# Patient Record
Sex: Female | Born: 1970 | Race: White | Hispanic: No
Health system: Southern US, Community
[De-identification: ages and names within clinical notes are randomized; demographics above are authoritative.]

## PROBLEM LIST (undated history)

## (undated) DIAGNOSIS — R05 Cough: Secondary | ICD-10-CM

## (undated) DIAGNOSIS — R062 Wheezing: Secondary | ICD-10-CM

## (undated) DIAGNOSIS — J40 Bronchitis, not specified as acute or chronic: Secondary | ICD-10-CM

## (undated) DIAGNOSIS — M2550 Pain in unspecified joint: Secondary | ICD-10-CM

## (undated) DIAGNOSIS — T8859XA Other complications of anesthesia, initial encounter: Secondary | ICD-10-CM

## (undated) DIAGNOSIS — L409 Psoriasis, unspecified: Secondary | ICD-10-CM

## (undated) DIAGNOSIS — IMO0001 Reserved for inherently not codable concepts without codable children: Secondary | ICD-10-CM

## (undated) DIAGNOSIS — J45909 Unspecified asthma, uncomplicated: Secondary | ICD-10-CM

## (undated) DIAGNOSIS — Z9889 Other specified postprocedural states: Secondary | ICD-10-CM

## (undated) DIAGNOSIS — R112 Nausea with vomiting, unspecified: Secondary | ICD-10-CM

## (undated) DIAGNOSIS — M1990 Unspecified osteoarthritis, unspecified site: Secondary | ICD-10-CM

## (undated) DIAGNOSIS — H9319 Tinnitus, unspecified ear: Secondary | ICD-10-CM

## (undated) DIAGNOSIS — J302 Other seasonal allergic rhinitis: Secondary | ICD-10-CM

## (undated) DIAGNOSIS — R5383 Other fatigue: Secondary | ICD-10-CM

## (undated) DIAGNOSIS — T4145XA Adverse effect of unspecified anesthetic, initial encounter: Secondary | ICD-10-CM

## (undated) DIAGNOSIS — E739 Lactose intolerance, unspecified: Secondary | ICD-10-CM

## (undated) DIAGNOSIS — I82531 Chronic embolism and thrombosis of right popliteal vein: Secondary | ICD-10-CM

## (undated) DIAGNOSIS — R197 Diarrhea, unspecified: Secondary | ICD-10-CM

## (undated) DIAGNOSIS — B269 Mumps without complication: Secondary | ICD-10-CM

## (undated) DIAGNOSIS — R059 Cough, unspecified: Secondary | ICD-10-CM

## (undated) DIAGNOSIS — L4050 Arthropathic psoriasis, unspecified: Secondary | ICD-10-CM

## (undated) DIAGNOSIS — R251 Tremor, unspecified: Secondary | ICD-10-CM

## (undated) DIAGNOSIS — R351 Nocturia: Secondary | ICD-10-CM

## (undated) DIAGNOSIS — K219 Gastro-esophageal reflux disease without esophagitis: Secondary | ICD-10-CM

## (undated) DIAGNOSIS — A491 Streptococcal infection, unspecified site: Secondary | ICD-10-CM

## (undated) DIAGNOSIS — K9041 Non-celiac gluten sensitivity: Secondary | ICD-10-CM

## (undated) DIAGNOSIS — R61 Generalized hyperhidrosis: Secondary | ICD-10-CM

## (undated) HISTORY — DX: Tremor, unspecified: R25.1

## (undated) HISTORY — DX: Chronic embolism and thrombosis of right popliteal vein: I82.531

## (undated) HISTORY — PX: APPENDECTOMY: SHX54

## (undated) HISTORY — PX: BREAST SURGERY: SHX581

## (undated) HISTORY — DX: Psoriasis, unspecified: L40.9

## (undated) HISTORY — DX: Pain in unspecified joint: M25.50

## (undated) HISTORY — PX: OTHER SURGICAL HISTORY: SHX169

## (undated) HISTORY — DX: Arthropathic psoriasis, unspecified: L40.50

---

## 2000-12-22 ENCOUNTER — Ambulatory Visit (HOSPITAL_COMMUNITY): Admission: RE | Admit: 2000-12-22 | Discharge: 2000-12-22 | Payer: Self-pay | Admitting: Oral and Maxillofacial Surgery

## 2000-12-22 ENCOUNTER — Encounter: Payer: Self-pay | Admitting: Oral and Maxillofacial Surgery

## 2001-01-13 ENCOUNTER — Ambulatory Visit (HOSPITAL_COMMUNITY): Admission: RE | Admit: 2001-01-13 | Discharge: 2001-01-13 | Payer: Self-pay | Admitting: Oral and Maxillofacial Surgery

## 2001-01-13 ENCOUNTER — Encounter: Payer: Self-pay | Admitting: Oral and Maxillofacial Surgery

## 2001-02-23 ENCOUNTER — Other Ambulatory Visit: Admission: RE | Admit: 2001-02-23 | Discharge: 2001-02-23 | Payer: Self-pay | Admitting: Family Medicine

## 2001-03-08 ENCOUNTER — Encounter: Payer: Self-pay | Admitting: Oral and Maxillofacial Surgery

## 2001-03-08 ENCOUNTER — Encounter (INDEPENDENT_AMBULATORY_CARE_PROVIDER_SITE_OTHER): Payer: Self-pay

## 2001-03-08 ENCOUNTER — Observation Stay (HOSPITAL_COMMUNITY): Admission: RE | Admit: 2001-03-08 | Discharge: 2001-03-09 | Payer: Self-pay | Admitting: Oral and Maxillofacial Surgery

## 2002-06-23 ENCOUNTER — Encounter: Admission: RE | Admit: 2002-06-23 | Discharge: 2002-06-23 | Payer: Self-pay | Admitting: Neurology

## 2002-06-23 ENCOUNTER — Encounter: Payer: Self-pay | Admitting: Neurology

## 2002-06-29 ENCOUNTER — Encounter: Payer: Self-pay | Admitting: Neurology

## 2002-06-29 ENCOUNTER — Encounter: Admission: RE | Admit: 2002-06-29 | Discharge: 2002-06-29 | Payer: Self-pay | Admitting: Neurology

## 2002-07-05 ENCOUNTER — Other Ambulatory Visit: Admission: RE | Admit: 2002-07-05 | Discharge: 2002-07-05 | Payer: Self-pay | Admitting: *Deleted

## 2002-07-05 ENCOUNTER — Encounter: Payer: Self-pay | Admitting: *Deleted

## 2002-07-05 ENCOUNTER — Encounter: Admission: RE | Admit: 2002-07-05 | Discharge: 2002-07-05 | Payer: Self-pay | Admitting: *Deleted

## 2002-07-12 ENCOUNTER — Ambulatory Visit: Admission: RE | Admit: 2002-07-12 | Discharge: 2002-07-12 | Payer: Self-pay | Admitting: Gynecology

## 2002-07-19 ENCOUNTER — Encounter (INDEPENDENT_AMBULATORY_CARE_PROVIDER_SITE_OTHER): Payer: Self-pay

## 2002-07-19 ENCOUNTER — Observation Stay (HOSPITAL_COMMUNITY): Admission: RE | Admit: 2002-07-19 | Discharge: 2002-07-20 | Payer: Self-pay | Admitting: *Deleted

## 2002-09-26 ENCOUNTER — Encounter: Payer: Self-pay | Admitting: Neurology

## 2002-09-26 ENCOUNTER — Ambulatory Visit (HOSPITAL_COMMUNITY): Admission: RE | Admit: 2002-09-26 | Discharge: 2002-09-26 | Payer: Self-pay | Admitting: Neurology

## 2003-07-27 ENCOUNTER — Other Ambulatory Visit: Admission: RE | Admit: 2003-07-27 | Discharge: 2003-07-27 | Payer: Self-pay | Admitting: *Deleted

## 2005-04-28 ENCOUNTER — Inpatient Hospital Stay (HOSPITAL_COMMUNITY): Admission: AD | Admit: 2005-04-28 | Discharge: 2005-04-28 | Payer: Self-pay | Admitting: *Deleted

## 2005-05-01 ENCOUNTER — Inpatient Hospital Stay (HOSPITAL_COMMUNITY): Admission: AD | Admit: 2005-05-01 | Discharge: 2005-05-01 | Payer: Self-pay | Admitting: *Deleted

## 2005-05-04 ENCOUNTER — Inpatient Hospital Stay (HOSPITAL_COMMUNITY): Admission: AD | Admit: 2005-05-04 | Discharge: 2005-05-04 | Payer: Self-pay | Admitting: *Deleted

## 2005-05-07 ENCOUNTER — Inpatient Hospital Stay (HOSPITAL_COMMUNITY): Admission: AD | Admit: 2005-05-07 | Discharge: 2005-05-07 | Payer: Self-pay | Admitting: *Deleted

## 2005-05-10 ENCOUNTER — Inpatient Hospital Stay (HOSPITAL_COMMUNITY): Admission: AD | Admit: 2005-05-10 | Discharge: 2005-05-10 | Payer: Self-pay | Admitting: *Deleted

## 2005-11-12 ENCOUNTER — Other Ambulatory Visit: Admission: RE | Admit: 2005-11-12 | Discharge: 2005-11-12 | Payer: Self-pay | Admitting: *Deleted

## 2005-11-25 ENCOUNTER — Encounter (INDEPENDENT_AMBULATORY_CARE_PROVIDER_SITE_OTHER): Payer: Self-pay | Admitting: Specialist

## 2005-11-25 ENCOUNTER — Encounter: Admission: RE | Admit: 2005-11-25 | Discharge: 2005-11-25 | Payer: Self-pay | Admitting: Family Medicine

## 2007-02-03 ENCOUNTER — Other Ambulatory Visit: Admission: RE | Admit: 2007-02-03 | Discharge: 2007-02-03 | Payer: Self-pay | Admitting: *Deleted

## 2009-09-25 ENCOUNTER — Other Ambulatory Visit: Admission: RE | Admit: 2009-09-25 | Discharge: 2009-09-25 | Payer: Self-pay | Admitting: Family Medicine

## 2010-05-06 ENCOUNTER — Encounter: Admission: RE | Admit: 2010-05-06 | Discharge: 2010-05-06 | Payer: Self-pay | Admitting: Family Medicine

## 2010-09-27 ENCOUNTER — Other Ambulatory Visit (HOSPITAL_COMMUNITY)
Admission: RE | Admit: 2010-09-27 | Discharge: 2010-09-27 | Disposition: A | Discharge status: Home / Self Care | Payer: BC Managed Care – PPO | Source: Ambulatory Visit | Attending: Family Medicine | Admitting: Family Medicine

## 2010-09-27 ENCOUNTER — Other Ambulatory Visit: Payer: Self-pay | Admitting: Family Medicine

## 2010-09-27 DIAGNOSIS — Z124 Encounter for screening for malignant neoplasm of cervix: Secondary | ICD-10-CM | POA: Insufficient documentation

## 2010-10-18 NOTE — Discharge Summary (Signed)
NAME:  Ruth Charles, Ruth Charles                        ACCOUNT NO.:  [id number]   MEDICAL RECORD NO.:  [id number]                   PATIENT TYPE:  OBV   LOCATION:  0465                                 FACILITY:  [facility]   PHYSICIAN:  Almedia Balls. Fore, M.D.                DATE OF BIRTH:  [birth year]   DATE OF ADMISSION:  07/19/2002  DATE OF DISCHARGE:  07/20/2002                                 DISCHARGE SUMMARY   HISTORY:  The patient is a 40 year old with large abdominopelvic mass who  was admitted on February 17 for exploratory laparotomy, possible TAH/BSO,  omentectomy, lymph node dissection.  The remainder of her history and  physical are as previously dictated.   LABORATORY DATA:  Preoperative hemoglobin 14.6.  CMET panel was normal.  Chest x-ray was normal.  CA-125 had been elevated at approximately 42.   HOSPITAL COURSE:  The patient was taken to the operating room on July 19, 2002 at which time exploratory laparotomy, decompression of a large  right ovarian cyst, right salpingo-oophorectomy, and appendectomy indicated  because of question lesion at distal end of the appendix were performed.  The patient did well postoperatively.  Diet and ambulation were progressed  over the evening of February 17 and early morning of July 20, 2002.  On  the morning of July 20, 2002 she was afebrile and experiencing no  problems.  It was felt that she could be discharged at this time.   FINAL DIAGNOSES:  1. Right mucinous cystadenoma.  2. Lesion of appendix.  3. Pelvic pain.   OPERATION:  1. Exploratory laparotomy.  2. Right salpingo-oophorectomy.  3. Indicated appendectomy.  Pathology report unavailable at the time of     dictation except that a frozen section report on the cyst revealed all to     be benign.   DISPOSITION:  Discharged home to return to the office in two weeks for  follow-up.  She was instructed to gradually progress her activities after  several weeks at home and  to limit lifting and driving for two weeks.  She  is to return in approximately five to six days for removal of staples from  the incision.  She was fully ambulatory, on a regular diet, and in good  condition at the time of discharge.  She was given prescription for Dilaudid  or generic 2 mg number 30 to be taken one or two q.4h. p.r.n. pain and  doxycycline 100 mg number 12 to be taken one b.i.d.  She will call for any  problems.                                               Almedia Balls. Randell Patient, M.D.    SRF/MEDQ  D:  07/20/2002  T:  07/20/2002  Job:  [id number]

## 2010-10-18 NOTE — Consult Note (Signed)
NAME:  Ruth Charles, Ruth Charles NO.:  [id number]   MEDICAL RECORD NO.:  [id number]                   PATIENT TYPE:  OUT   LOCATION:  GYN                                  FACILITY:  [facility]   PHYSICIAN:  De Blanch, M.D.         DATE OF BIRTH:  02-Jun-1971   DATE OF CONSULTATION:  DATE OF DISCHARGE:                                   CONSULTATION   HISTORY OF PRESENT ILLNESS:  The patient is a 40 year old white female seen  in consultation at the request of Dr. Chriss Driver regarding management of a  newly diagnosed large abdominal mass.  The patient was relatively  asymptomatic until recently when she developed abdominal pain and sciatic  pain.  In the course of that evaluation, she was found to have a large mass.  CT scan shows that she has a 24 x 22 x 11 cm soft tissue mass in the pelvis  and extending into the upper abdomen.  It is inhomogeneous and contains a  small amount of central calcifications.  It is difficult to determine  radiographically whether the mass arises from the uterus or from the right  ovary.  There is no evidence of any metastatic disease, nor was there any  free fluid.  The upper abdomen is normal including kidneys and liver.  A  CA125 value has been obtained which is 42 units/ml.   PAST MEDICAL HISTORY:  Medical illnesses include gastroesophageal reflux  disease and psoriasis.   PAST SURGICAL HISTORY:  TMJ surgery 2002.   CURRENT MEDICATIONS:  Nexium.   ALLERGIES:  SULFA.   FAMILY HISTORY:  Negative for gynecologic, breast or colon cancers.   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  She  does not smoke and is not married.  Has never been pregnant.   REVIEW OF SYSTEMS:  Some abdominal pain and pressure, gastroesophageal  reflux symptoms and pelvic pain.   PHYSICAL EXAMINATION:  VITAL SIGNS:  Height 5 feet 4 inches, weight 181  pounds, blood pressure 130/86, pulse 80, respiratory rate 18.  GENERAL:  The patient is healthy white female in no acute distress.  HEENT:  Negative.  NECK:  Supple without thyromegaly. There is no supraclavicular or inguinal  adenopathy.  ABDOMEN:  Distended and there is a mass extending especially in the right  upper quadrant to the costal margin.  This is nontender.  PELVIC:  EGBUS, vagina, bladder, urethra normal.  Cervix is normal.  On  bimanual examination, the mass extends out of the pelvis.  It does not  really fill the posterior cul-de-sac.  It is difficult to distinguish the  mass from the uterus.   LABORATORY DATA:  The patient's scans and lab work from Dr. Delorse Lek office is  reviewed.   IMPRESSION:  Large abdominopelvic mass of either uterine fibroids or ovarian  neoplasm.  I had a lengthy discussion with the patient and her friend  regarding management options.  Obviously we recommend she undergo  exploratory laparotomy with resection of the mass.  Preservation of the  uterus would be attempted if this is uterine fibroids.  On the other hand,  if this is an ovarian neoplasm, I think it is unlikely that residual ovarian  tissue can be preserved on that side.  If there is a normal ovary, we will  definitely preserve it.  Further, if this is a malignancy, we discussed the  need for surgical staging including possible peritoneal biopsies,  omentectomy and lymphadenectomy.  The risks of surgery have been reviewed.  The patient wishes to proceed with surgery, understanding that she will need  a large midline incision and a mechanical bowel prep preoperatively.  All  their questions were answered.  Surgery is scheduled for next Tuesday.  The  patient understands that Dr. Ronita Hipps will be the gynecologic oncologist  involved with this case.                                                De Blanch, M.D.    DC/MEDQ  D:  07/12/2002  T:  07/12/2002  Job:  [id number]   cc:   Almedia Balls. Fore, M.D.  [phone] [address] [place]  [place] [address]  Fax: [phone]   Telford Nab, R.N.  [address] [place], [place] [address]  Fax: 1   Gretta Arab. Valentina Lucks, M.D.  [address] [place]  [place] [address]  Fax: [phone]   Candy Sledge, M.D.  [address]  [address]  [place]  [place] [address]  Fax: [phone]

## 2010-10-18 NOTE — Op Note (Signed)
NAME:  Ruth Charles, Ruth Charles                        ACCOUNT NO.:  [id number]   MEDICAL RECORD NO.:  [id number]                   PATIENT TYPE:  OUT   LOCATION:  GYN                                  FACILITY:  [facility]   PHYSICIAN:  Almedia Balls. Fore, M.D.                DATE OF BIRTH:  November 23, 1970   DATE OF PROCEDURE:  07/19/2002  DATE OF DISCHARGE:  07/12/2002                                 OPERATIVE REPORT   PREOPERATIVE DIAGNOSIS:  Large abdominopelvic mass, question ovarian  neoplasm.   POSTOPERATIVE DIAGNOSIS:  Probable mucinous cystadenoma, right ovary,  question involvement of appendix.   OPERATION PERFORMED:  Exploratory laparotomy, right salpingo-oophorectomy  and appendectomy.   CO-SURGEONS:  1. Almedia Balls. Randell Patient, M.D.  2. John T. Kyla Balzarine, M.D.   ANESTHESIA:  General orotracheal anesthesia.   INDICATIONS FOR PROCEDURE:  The patient is a 41 year old with large  abdominopelvic mass for exploratory laparotomy on July 19, 2002.  She  has been fully counseled as to the possibilities of the procedure to include  TAH/BSO, omentectomy, lymph node dissection and hormone replacement  following surgery as well as other considerations to include risks of  anesthesia, injury to bowel, bladder, blood vessels, ureters, postoperative  hemorrhage, infection and recuperation.  She fully understands all these  considerations and wishes to proceed on July 19, 2002.   OPERATIVE FINDINGS:  On entry into the abdomen, there was a large smooth-  walled cyst involving the right ovary approximately 24 to 25 cm in greatest  dimensions.  It was multiloculated with several smaller compartments.  The  largest compartment contained a thick mucinous material.  The secondary  smaller compartment contained more of a serous type fluid.  Full exploration  of the abdomen and pelvis revealed no apparent lesions on the liver,  diaphragms, periaortic areas or in the pelvis.  Bowel was also notably free  of any  lesions.   DESCRIPTION OF PROCEDURE:  With the patient under general anesthesia,  prepared and draped in sterile fashion in a dorsal lithotomy position in  Regal stirrups, and with the Foley catheter in the bladder, a lower  abdominal vertical incision was made and carried into the peritoneal cavity  without difficulty.  Small bleeders were rendered hemostatic with Bovie  electrocoagulation.  The cyst wall was encountered immediately upon entry  into the peritoneum.  Pelvic washings were taken and saved for cytology  later.  A pursestring suture was placed in the antimesenteric border of the  cyst wall with an incision and an attempt to evacuate the contents using  suction.  Because of the viscosity of the fluid, it was impossible to  provide adequate suction with the wall suction apparatus.  The Neptune  suction evacuator was then brought in to further evacuate the contents of  the cyst.  There was spill of cyst fluid within the peritoneal cavity.  A  second smaller loculated area  was then incised after a pursestring suture  with findings of serous fluid and easy evacuation with wall suction.  It was  then possible to elevate the cyst wall to isolate the infundibulopelvic  ligament which was accomplished without difficulty. The structure was then  clamped, cut, and doubly ligated with 0 Vicryl.  The ovary was then  dissected free of its attachment using Bovie electrocoagulation with a clamp  being placed across the right tube and uterine ovarian anastomosis.  This  was then cut free with the specimen being sent for frozen section.  The  cornual area on the uterus was rendered hemostatic and reapproximated with  two sutures of 0 Vicryl.  The frozen section report was that of a benign  probable mucinous tumor.  Attention was directed to the appendix which was  found to have some thickening in the distal end.  The mesoappendix was  isolated with Bovie electrocoagulation.  The vessels were  clamped and  ligated with 1 Vicryl.  The appendix was clamped at its stump and removed  with the stump being tied with 0 Vicryl.  Bovie electrocoagulation was  necessary to further render the mesentery of the appendix hemostatic.  The  area was lavaged with 3L of normal saline solution to provide for removal of  all of the mucinous fluid which had been spilled in the peritoneum.  After  noting that the irrigation was clear, hemostasis maintained, and sponge and  instrument counts were correct, the peritoneum and fascial layers were  closed with a Smead-Jones closure of #1 PDS.  The subcutaneous fat layer was  lavaged with copious amounts of lactated Ringer's solution, and the skin was  reapproximated with staples.  The estimated blood loss for this procedure  was less than 50 ml.  The patient was taken to the recovery room in good  condition with clear urine in the Foley catheter tubing.  She will be placed  on 23 hour observation following surgery.                                                Almedia Balls. Randell Patient, M.D.    SRF/MEDQ  D:  07/19/2002  T:  07/19/2002  Job:  [id number]

## 2010-10-18 NOTE — H&P (Signed)
NAME:  ROYALTI, SCHAUF                        ACCOUNT NO.:  [id number]   MEDICAL RECORD NO.:  [id number]                   PATIENT TYPE:  INP   LOCATION:  NA                                   FACILITY:  [facility]   PHYSICIAN:  Almedia Balls. Fore, M.D.                DATE OF BIRTH:  March 15, 1971   DATE OF ADMISSION:  07/19/2002  DATE OF DISCHARGE:                                HISTORY & PHYSICAL   CHIEF COMPLAINT:  Left abdominal mass.   HISTORY OF PRESENT ILLNESS:  The patient is a 40 year old gravida 0 whose  last menstrual period began on July 14, 2002.  She has been seen in our  office since July 05, 2002, for large left abdominopelvic mass.  This was  found on CT scan performed on June 29, 2002.  The mass was described as a  24 x 0.6 x 22.6 x 11.8 cm soft-tissue mass extending from the upper portion  of the uterine fundus into the left upper abdominal area.  There were no  enlarged lymph nodes and no free peritoneal fluid.  The left ovary was  separate from the mass and had a 2.3 cm simple-appearing cyst.  A CA125 was  obtained at 42.1, elevated over the normal 30.2 upper limits.  Pap smear was  negative on July 05, 2002.  The patient is admitted at this time for  exploratory laparotomy, possible abdominal hysterectomy, bilateral salpingo-  oophorectomy, omentectomy, and lymph node dissection.  She has been fully  counseled as to the nature of this procedure and the risks involved to  include risks of anesthesia, injury to bowel, bladder, blood vessels,  ureters, postoperative hemorrhage, infection, recuperation, and use of  hormone replacement should her ovaries be removed.  She fully understands  all these considerations and wishes to proceed on July 19, 2002.   PAST MEDICAL HISTORY:  1. TMJ surgery in November 2002.  2. She has apparent GERD and takes Nexium for this.   ALLERGIES:  She is allergic to SULFA-BASED ANTIBIOTICS.   SOCIAL HISTORY:  She is a  nonsmoker.   FAMILY HISTORY:  Maternal grandmother and paternal grandfather with  cardiovascular disease, and a maternal uncle with diabetes mellitus.   REVIEW OF SYSTEMS:  HEENT:  Negative.  CARDIORESPIRATORY:  Negative.  GASTROINTESTINAL:  As noted above.  GENITOURINARY:  As noted above.  NEUROMUSCULAR:  Negative.   PHYSICAL EXAMINATION:  VITAL SIGNS:  Height 5 feet 4-1/4 inches, weight 185.  Blood pressure 130/80, pulse 84, respirations 18.  GENERAL:  Well-developed white female in no acute distress.  HEENT:  Within normal limits.  NECK:  Supple.  Without masses, adenopathy, or bruit.  HEART:  Regular rate and rhythm.  Without murmurs.  LUNGS:  Clear to P&A.  BREASTS:  Examined sitting and lying without mass.  Axilla negative.  ABDOMEN:  Mass in the left lower abdomen to left upper abdomen area,  tender  on palpation.  PELVIC:  External genitalia, Bartholin, urethral, and Skene's glands within  normal limits.  Cervix slightly inflamed.  Uterus is difficult to define but  believe that it is mid position and normal in size, shape, and contour, with  slight displacement to the right.  Adnexal exam reveals a mass effect on the  left without definite palpable ovaries.  Anterior and posterior cul-de-sac  exam was confirmatory.  EXTREMITIES:  Within normal limits.  CENTRAL NERVOUS SYSTEM:  Grossly intact.  SKIN:  Without suspicious lesions.   IMPRESSION:  Abdominopelvic mass.   DISPOSITION:  As noted above.                                               Almedia Balls. Randell Patient, M.D.    SRF/MEDQ  D:  07/14/2002  T:  07/14/2002  Job:  [id number]

## 2010-10-18 NOTE — Op Note (Signed)
[facility]  Patient:    Ruth Charles, Ruth Charles Visit Number: [id number] MRN: [id number]          Service Type: SUR Location: 4W 0442 01 Attending Physician:  Beatriz Chancellor Dictated by:   Lyndal Pulley Aquilla Hacker., M.D. Proc. Date: 03/08/01 Admit Date:  03/08/2001                             Operative Report  PREOPERATIVE DIAGNOSES: 1. Right mandibular condylar mass. 2. Right temporomandibular joint internal derangement.  POSTOPERATIVE DIAGNOSES: 1. Right mandibular condylar mass. 2. Right temporomandibular joint internal derangement.  OPERATION: 1. Right temporomandibular joint arthroplasty. 2. Removal of right mandibular condylar mass.  SURGEON:  Lyndal Pulley. Aquilla Hacker., M.D.  ANESTHESIA:  General via nasoendotracheal tube.  INDICATIONS:  The patient is a 40 year old lady who was initially referred to the office with right TMJ and facial pain.  On detailed examination, CT scan, MRI scan, a large right mandibular condylar mass was identified.  DESCRIPTION OF PROCEDURE: The patient was identified in the holding area and taken to the operating room where she was placed supine on the operating room table.  Following intravenous induction of anesthesia, the patient was taken to the operating room where she was placed supine on the operating room table. Following intravenous induction of anesthesia, the patient was nasally intubated without difficulty.  The nasal endotracheal tube was secured, and the patients head turned to the left 90 degrees.  The table was also turned 90 degrees from anesthesia to provide access to the right facial area.  The patient was then prepped and draped in the usual fashion for TMJ procedures. Attention was directed to the right preauricular area where a fine surgical marker was used to outline the preauricular and endaural incision to be made. Approximately 4 cc of 2% lidocaine with epinephrine was  infiltrated subcutaneously along the planned incision line.  A #15 scalpel was used to make the skin incision which was carried endaurally.  Bipolar electrocautery was utilized throughout the procedure for hemostasis.  A layered dissection was then carried down to the superficial layer of the deep temporal fascia along the entire preauricular area.  A small hockey stick shaped incision was then created through the superficial layer of the deep temporal fascia over the zygomatic arch identifying the temporal fat pad.  The incision was carried inferiorly over the lateral capsular area.  A periosteal elevator was used to carefully reflect a subperiosteal dissection over the zygomatic arch revealing the lateral capsule inferiorly.  Using condylar retraction the superior joint space of the right TMJ was entered without difficulty.  Both the fossa and the distal tissue were inspected and found to be essentially normal.  There were no arthritic changes seen within the fossa, and the disk was smooth and thin. The inferior joint space was entered as well, and the right mandibular condyle identified.  It was seen as enlarged both anteriorly and medially.  Using a reciprocating Stryker saw an approximate 8 mm superior portion of the condyle was removed.  Approximately 2 mm of additional condylar mass was removed from the anterior pole.  The edges of the remaining condyle were then rasped utilizing the reciprocating Stryker rasp.  A hand-held rasp was also used to refine the margins of the remaining condyle.  The operative site was irrigated with copious amounts of sterile saline and both a lateral capsulorrhaphy and a meniscal plication were completed utilizing  a 5-0 PDS suture in an interrupted fashion.  The overlying fascia and musculature was closed in a layered fashion as well utilizing 3-0 Vicryl suture in an interrupted fashion.  Several subcutaneous sutures were placed with 4-0 Vicryl suture and  the superior portion of the skin closed with a 6-0 nylon suture in a running baseball fashion.  The endaural incision was closed with 5-0 plain gut suture.  The external auditory canal was then visualized and irrigated free of clot and debris.  The wound was coated with Neosporin ointment and a pressure type facial dressing was placed with Fluffs, concentrating pressure in the preauricular area.  The patient was then allowed to awaken from the anesthesia, extubated in the operating room and transferred to the post anesthesia care unit in stable condition having tolerated the procedure well. Estimated blood loss was 50 cc.  Intraoperative medications included 1 g of Ancef and 10 mg of Decadron.  The mandibular condylar specimen was submitted to pathology for microscopic examination.  Dictated by:   Lyndal Pulley Aquilla Hacker., M.D. Attending Physician:  Beatriz Chancellor DD:  03/08/01 TD:  03/08/01 Job: [id number]

## 2010-10-18 NOTE — Op Note (Signed)
NAME:  Ruth Charles, Ruth Charles                        ACCOUNT NO.:  [id number]   MEDICAL RECORD NO.:  [id number]                   PATIENT TYPE:  INP   LOCATION:  X002                                 FACILITY:  [facility]   PHYSICIAN:  John T. Kyla Balzarine, M.D.                 DATE OF BIRTH:  1970/07/06   DATE OF PROCEDURE:  07/19/2002  DATE OF DISCHARGE:                                 OPERATIVE REPORT   PREOPERATIVE DIAGNOSIS:  Pelvic mass with elevated CA-125 value.   POSTOPERATIVE DIAGNOSES:  1. 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  2. Probable appendiceal fecalith.   PROCEDURES:  1. Right salpingo-oophorectomy.  2. Right appendectomy.   ANESTHESIA:  General endotracheal.   SURGEON:  John T. Kyla Balzarine, M.D.   ASSISTANT:  Almedia Balls. Randell Patient, M.D.   FINDINGS AND INDICATIONS FOR SURGERY:  This 40 year old woman presented with  abdominal pain and large abdominal mass.  CT scan revealed a 24 x 22 x 11 cm  mass arising from the pelvis.  CA-125 value was 42 units/mL.  Examination  under anesthesia confirmed a large mass, mobile and separate from the  uterus, extending from the central pelvis to the midepigastrium.  On  exploratory laparotomy, a smooth cyst was found to be replacing the right  ovary, measuring in excess of 20 cm, without adhesions to adjacent  structures.  There were no visible or palpable abnormalities and no free  fluid or mucinous material in the abdominal cavity.  Because of the large  size of the cyst, it was drained of approximately 3 L of mucinous material,  allowing delivery of the cyst through a subumbilical incision.  Because of  the extensive destruction of the right ovary and normal appearance of the  left tube and ovary, right salpingo-oophorectomy was performed.  Inspection  of omentum and careful palpation of the upper abdomen and lymph node regions  revealed no abnormalities.  Uterus, left tube, and ovary were essentially  normal, with ovulatory stigmata  of the left ovary.  The appendix had a 5 x 5  mm firm nodule at its tip, compatible with fecalith; because of the mucinous  nature of the tumor, appendectomy was indicated.  Frozen section  subsequently returned a benign, likely mucinous cystadenoma of the right  ovary.   DESCRIPTION OF PROCEDURE:  The patient was prepped and draped in the low  lithotomy position after examination under anesthesia revealed the above  findings.  Foley catheter was placed sterilely.  The patient was explored  through a lower abdominal midline incision, using scalpel and electrocautery  for hemostasis.  The peritoneum was opened and findings as described above  were encountered.  Lap pads were placed adjacent to the anterior surface of  the cyst to provide a dam.  A pursestring suture of 3-0 Vicryl was placed  and a stab incision made into the ovarian cyst.  Using a variety of suction  devices, approximately 2 L of mucinous material was evacuated from the major  portion of the cyst.  There was a septation and a separate cystic component  cephalad, which was likewise drained of approximately 1 L of slightly more  serous clear fluid.  Following drainage of the cyst, the right tube and  ovary were elevated out of the incision.  Uterus, left tube and ovary were  visualized and found to be abnormal.  A window was created in the  mesosalpinx, isolating the infundibulopelvic ligament on the right side.  This was crossclamped, divided, and ligated with free ties of 0 Vicryl.  The  mesosalpinx was incised with electrocautery, skeletonizing the right utero-  ovarian ligament and fallopian tube at the cornu of the uterus, where these  structures were crossclamped, divided, and suture ligated with two  transfixing sutures of 0 Vicryl.  Additional hemostasis was achieved with  electrocautery.  There had been spill of mucinous material during the  procedure.  While awaiting results of frozen section, the abdomen was   copiously irrigated with greater than 3 L of normal saline, removing all  mucinous material from the abdominal cavity.  Manual and visual exploration  was carried out, confirming the findings described above.  The gallbladder  was palpably normal.  Because the patient had the mucinous tumor and there  was a firm nodule at the tip of the appendix, appendectomy was indicated to  exclude the possibility of an occult appendiceal mucinous tumor metastatic  to the ovary.   The mesentery of the appendix was developed into pedicles, which were  controlled with electrocautery and suture ligatures of 2-0 Vicryl.  The base  of the appendix was crushed, crossclamped, and divided with a scalpel.  The  stump of the appendix was controlled with a transfixing suture of 2-0  Vicryl.  Additional irrigation was carried out and hemostasis achieved in  the mesentery of the appendix with electrocautery.  All packs and retractors  were removed and abdominal contents in the operative sites inspected.  The  abdominal wall was closed in layers, irrigating between layers and using 0  PDS to close the rectus muscles and fascia together in a running Smead-Jones  closure, and clips to close the skin.  The patient was returned to the  recovery room in stable condition.  The estimated blood loss 50 mL.  Transfusions:  None.  Drains, packs, etc:  Foley to dependent drainage.  Sponge and instrument counts correct.  Pathology specimens:  Right tube and  ovary, appendix.                                               John T. Kyla Balzarine, M.D.    JTS/MEDQ  D:  07/19/2002  T:  07/19/2002  Job:  [id number]   cc:   Almedia Balls. Fore, M.D.  [phone] [address] [place]  [place] [address]  Fax: [phone]   Telford Nab, R.N.   Gretta Arab. Valentina Lucks, M.D.  [address] [place]  [place] [address]  Fax: [phone]   Candy Sledge, M.D.  [address]  [address]  [place] [address]  Fax: [phone]

## 2011-10-01 ENCOUNTER — Other Ambulatory Visit: Payer: Self-pay | Admitting: Family Medicine

## 2011-10-01 ENCOUNTER — Other Ambulatory Visit (HOSPITAL_COMMUNITY)
Admission: RE | Admit: 2011-10-01 | Discharge: 2011-10-01 | Disposition: A | Discharge status: Home / Self Care | Payer: BC Managed Care – PPO | Source: Ambulatory Visit | Attending: Family Medicine | Admitting: Family Medicine

## 2011-10-01 DIAGNOSIS — Z124 Encounter for screening for malignant neoplasm of cervix: Secondary | ICD-10-CM | POA: Insufficient documentation

## 2014-10-27 NOTE — H&P (Signed)
UNICOMPARTMENTAL KNEE ADMISSION H&P  Patient is being admitted for left patellofemoral unicompartmental knee arthroplasty.  Subjective:  Chief Complaint:  Left knee patellofemoral compartmental primary OA /pain    HPI: Ruth Charles, 44 y.o. female female, has a history of pain and functional disability in the left and has failed non-surgical conservative treatments for greater than 12 weeks to include NSAID's and/or analgesics, corticosteriod injections, viscosupplementation injections and activity modification.  Onset of symptoms was gradual, starting years ago with gradually worsening course since that time. The patient noted no past surgery on the left knee(s).  Patient currently rates pain in the left knee(s) at 9 out of 10 with activity. Patient has night pain, worsening of pain with activity and weight bearing, pain that interferes with activities of daily living, pain with passive range of motion, crepitus and joint swelling.  Patient has evidence of periarticular osteophytes, joint space narrowing and almost complete loss of cartilage of the patellofemoral compartment by imaging studies.  There is no active infection.  Risks, benefits and expectations were discussed with the patient.  Risks including but not limited to the risk of anesthesia, blood clots, nerve damage, blood vessel damage, failure of the prosthesis, infection and up to and including death.  Patient understand the risks, benefits and expectations and wishes to proceed with surgery.   PCP: Astrid Divine, MD  D/C Plans:      Home with HHPT  Post-op Meds:       No Rx given /  Tranexamic Acid:      To be given - IV    Decadron:    It is to be given  FYI:     ASA post-op  Norco post-op    Past Medical History  Diagnosis Date  . Complication of anesthesia   . PONV (postoperative nausea and vomiting)   . Tinnitus   . Asthma   . Bronchitis     hx of   . GERD (gastroesophageal reflux disease)   .  Arthritis   . Mumps     hx of   . Streptococcal infection     hx of   . Night sweats   . Fatigue   . Shortness of breath dyspnea   . Seasonal allergies   . Wheezing   . Cough   . Diarrhea   . Frequent urination at night   . Lactose intolerance   . Gluten intolerance      Past Surgical History  Procedure Laterality Date  . Right tmj      2002  . Removal right ovary with cyst      2004  . Appendectomy      2004  . Breast surgery      breast biopsy left     Allergies  Allergen Reactions  . Corn-Containing Products Other (See Comments)    resp distress  . Gluten Meal Other (See Comments)    Nausea and diarrhea  . Lactose Intolerance (Gi)     resp distress   . Sulfa Antibiotics Hives    History  Substance Use Topics  . Smoking status: Never Smoker   . Smokeless tobacco: Never Used  . Alcohol Use: Yes     Comment: wine and liquor 8-14 glasses per week     No family history on file.   Review of Systems  Constitutional: Negative.   HENT: Negative.   Eyes: Negative.   Respiratory: Positive for cough (because of her allergies).   Cardiovascular: Negative.  Gastrointestinal: Positive for heartburn.  Genitourinary: Negative.   Musculoskeletal: Positive for joint pain.  Skin: Negative.   Neurological: Negative.   Endo/Heme/Allergies: Positive for environmental allergies.  Psychiatric/Behavioral: Positive for memory loss.     Objective:   Physical Exam  Constitutional: She is oriented to person, place, and time and well-developed, well-nourished, and in no distress.  HENT:  Head: Normocephalic.  Eyes: Pupils are equal, round, and reactive to light.  Neck: Neck supple. No JVD present. No tracheal deviation present. No thyromegaly present.  Cardiovascular: Normal rate, regular rhythm, normal heart sounds and intact distal pulses.   Pulmonary/Chest: Effort normal and breath sounds normal. No stridor. No respiratory distress. She has no wheezes.   Abdominal: Soft. There is no tenderness. There is no guarding.  Musculoskeletal:       Left knee: She exhibits swelling and bony tenderness. She exhibits no ecchymosis, no deformity, no laceration and no erythema. Tenderness found.  Lymphadenopathy:    She has no cervical adenopathy.  Neurological: She is alert and oriented to person, place, and time.  Skin: Skin is warm and dry.  Psychiatric: Affect normal.      Imaging Review Plain radiographs demonstrate severe degenerative joint disease of the left knee(s) patellofemoral compartment. The overall alignment is neutral. The bone quality appears to be good for age and reported activity level.  Assessment/Plan:  End stage arthritis, left knee patellofemoral compartment  The patient history, physical examination, clinical judgment of the provider and imaging studies are consistent with end stage degenerative joint disease of the left knee(s) and patellofemoral unicompartmental knee arthroplasty is deemed medically necessary. The treatment options including medical management, injection therapy arthroscopy and arthroplasty were discussed at length. The risks and benefits of total knee arthroplasty were presented and reviewed. The risks due to aseptic loosening, infection, stiffness, patella tracking problems, thromboembolic complications and other imponderables were discussed. The patient acknowledged the explanation, agreed to proceed with the plan and consent was signed. Patient is being admitted for outpatient / observation treatment for surgery, pain control, PT, OT, prophylactic antibiotics, VTE prophylaxis, progressive ambulation and ADL's and discharge planning. The patient is planning to be discharged home with home health services.        Anastasio Auerbach Susanna Benge   PA-C  11/05/2014, 1:57 PM

## 2014-11-02 ENCOUNTER — Encounter (HOSPITAL_COMMUNITY)
Admission: RE | Admit: 2014-11-02 | Discharge: 2014-11-02 | Disposition: A | Discharge status: Home / Self Care | Payer: Managed Care, Other (non HMO) | Source: Ambulatory Visit | Attending: Orthopedic Surgery | Admitting: Orthopedic Surgery

## 2014-11-02 ENCOUNTER — Encounter (HOSPITAL_COMMUNITY): Payer: Self-pay

## 2014-11-02 ENCOUNTER — Ambulatory Visit (HOSPITAL_COMMUNITY)
Admission: RE | Admit: 2014-11-02 | Discharge: 2014-11-02 | Disposition: A | Discharge status: Home / Self Care | Payer: Managed Care, Other (non HMO) | Source: Ambulatory Visit | Attending: Anesthesiology | Admitting: Anesthesiology

## 2014-11-02 DIAGNOSIS — J45909 Unspecified asthma, uncomplicated: Secondary | ICD-10-CM

## 2014-11-02 DIAGNOSIS — M1712 Unilateral primary osteoarthritis, left knee: Secondary | ICD-10-CM | POA: Diagnosis not present

## 2014-11-02 DIAGNOSIS — Z01818 Encounter for other preprocedural examination: Secondary | ICD-10-CM | POA: Insufficient documentation

## 2014-11-02 HISTORY — DX: Other fatigue: R53.83

## 2014-11-02 HISTORY — DX: Non-celiac gluten sensitivity: K90.41

## 2014-11-02 HISTORY — DX: Tinnitus, unspecified ear: H93.19

## 2014-11-02 HISTORY — DX: Streptococcal infection, unspecified site: A49.1

## 2014-11-02 HISTORY — DX: Gastro-esophageal reflux disease without esophagitis: K21.9

## 2014-11-02 HISTORY — DX: Cough, unspecified: R05.9

## 2014-11-02 HISTORY — DX: Lactose intolerance, unspecified: E73.9

## 2014-11-02 HISTORY — DX: Unspecified asthma, uncomplicated: J45.909

## 2014-11-02 HISTORY — DX: Other seasonal allergic rhinitis: J30.2

## 2014-11-02 HISTORY — DX: Reserved for inherently not codable concepts without codable children: IMO0001

## 2014-11-02 HISTORY — DX: Cough: R05

## 2014-11-02 HISTORY — DX: Other specified postprocedural states: Z98.890

## 2014-11-02 HISTORY — DX: Other complications of anesthesia, initial encounter: T88.59XA

## 2014-11-02 HISTORY — DX: Other specified postprocedural states: R11.2

## 2014-11-02 HISTORY — DX: Unspecified osteoarthritis, unspecified site: M19.90

## 2014-11-02 HISTORY — DX: Diarrhea, unspecified: R19.7

## 2014-11-02 HISTORY — DX: Mumps without complication: B26.9

## 2014-11-02 HISTORY — DX: Nocturia: R35.1

## 2014-11-02 HISTORY — DX: Generalized hyperhidrosis: R61

## 2014-11-02 HISTORY — DX: Wheezing: R06.2

## 2014-11-02 HISTORY — DX: Bronchitis, not specified as acute or chronic: J40

## 2014-11-02 HISTORY — DX: Adverse effect of unspecified anesthetic, initial encounter: T41.45XA

## 2014-11-02 LAB — CBC
HCT: 40.8 % (ref 36.0–46.0)
Hemoglobin: 13.5 g/dL (ref 12.0–15.0)
MCH: 35.1 pg — AB (ref 26.0–34.0)
MCHC: 33.1 g/dL (ref 30.0–36.0)
MCV: 106 fL — AB (ref 78.0–100.0)
Platelets: 288 10*3/uL (ref 150–400)
RBC: 3.85 MIL/uL — ABNORMAL LOW (ref 3.87–5.11)
RDW: 14.4 % (ref 11.5–15.5)
WBC: 8.5 10*3/uL (ref 4.0–10.5)

## 2014-11-02 LAB — BASIC METABOLIC PANEL
ANION GAP: 6 (ref 5–15)
BUN: 11 mg/dL (ref 6–20)
CALCIUM: 8.9 mg/dL (ref 8.9–10.3)
CHLORIDE: 107 mmol/L (ref 101–111)
CO2: 23 mmol/L (ref 22–32)
CREATININE: 0.75 mg/dL (ref 0.44–1.00)
GFR calc Af Amer: >60 mL/min (ref >60)
GFR calc non Af Amer: >60 mL/min (ref >60)
Glucose, Bld: 93 mg/dL (ref 65–99)
Potassium: 4.1 mmol/L (ref 3.5–5.1)
Sodium: 136 mmol/L (ref 135–145)

## 2014-11-02 LAB — HCG, SERUM, QUALITATIVE: Preg, Serum: NEGATIVE

## 2014-11-02 LAB — URINALYSIS, ROUTINE W REFLEX MICROSCOPIC
Bilirubin Urine: NEGATIVE
Glucose, UA: NEGATIVE mg/dL
Hgb urine dipstick: NEGATIVE
Ketones, ur: NEGATIVE mg/dL
Leukocytes, UA: NEGATIVE
Nitrite: NEGATIVE
PH: 6 (ref 5.0–8.0)
Protein, ur: NEGATIVE mg/dL
SPECIFIC GRAVITY, URINE: 1.018 (ref 1.005–1.030)
Urobilinogen, UA: 0.2 mg/dL (ref 0.0–1.0)

## 2014-11-02 LAB — APTT: aPTT: 25 seconds (ref 24–37)

## 2014-11-02 LAB — PROTIME-INR
INR: 1.03 (ref 0.00–1.49)
Prothrombin Time: 13.7 seconds (ref 11.6–15.2)

## 2014-11-02 LAB — SURGICAL PCR SCREEN
MRSA, PCR: NEGATIVE
Staphylococcus aureus: NEGATIVE

## 2014-11-02 LAB — ABO/RH: ABO/RH(D): B NEG

## 2014-11-02 NOTE — Progress Notes (Signed)
Pt states she has intolerance to corn related products. When placed in allergy section noted contraindication with Cefazolin. Dr Charlann Boxer made aware.

## 2014-11-02 NOTE — Patient Instructions (Addendum)
Ruth Charles  11/02/2014   Your procedure is scheduled on:  Monday November 06, 2014   Report to [facility] Main  Entrance and follow signs to               Short Stay Center arrive at 0530 AM.  Call this number if you have problems the morning of surgery [phone]   Remember: ONLY 1 PERSON MAY GO WITH YOU TO SHORT STAY TO GET  READY MORNING OF YOUR SURGERY.  Do not eat food or drink liquids :After Midnight.     Take these medicines the morning of surgery with A SIP OF WATER: ALprazolam (xanax) if needed; Albuterol Inhaler if needed (bring day of surgery); Symbicort Inhaler if needed (bring day of surgery); Claritin if needed; Flonase if needed (bring day of surgery                                You may not have any metal on your body including hair pins and              piercings  Do not wear jewelry, make-up, lotions, powders or perfumes, deodorant             Do not wear nail polish.  Do not shave  48 hours prior to surgery.                Do not bring valuables to the hospital. [facility] IS NOT             RESPONSIBLE   FOR VALUABLES.  Contacts, dentures or bridgework may not be worn into surgery.  Leave suitcase in the car. After surgery it may be brought to your room.                  Please read over the following fact sheets you were given:MRSA INFORMATION SHEET;BLOOD TRANSFUSION FACT SHEET;INCENTIVE SPIROMETRY  _____________________________________________________________________             [facility] - Preparing for Surgery Before surgery, you can play an important role.  Because skin is not sterile, your skin needs to be as free of germs as possible.  You can reduce the number of germs on your skin by washing with CHG (chlorahexidine gluconate) soap before surgery.  CHG is an antiseptic cleaner which kills germs and bonds with the skin to continue killing germs even after washing. Please DO NOT use if you have an allergy to CHG or  antibacterial soaps.  If your skin becomes reddened/irritated stop using the CHG and inform your nurse when you arrive at Short Stay. Do not shave (including legs and underarms) for at least 48 hours prior to the first CHG shower.  You may shave your face/neck. Please follow these instructions carefully:  1.  Shower with CHG Soap the night before surgery and the  morning of Surgery.  2.  If you choose to wash your hair, wash your hair first as usual with your  normal  shampoo.  3.  After you shampoo, rinse your hair and body thoroughly to remove the  shampoo.                           4.  Use CHG as you would any other liquid soap.  You can apply chg  directly  to the skin and wash                       Gently with a scrungie or clean washcloth.  5.  Apply the CHG Soap to your body ONLY FROM THE NECK DOWN.   Do not use on face/ open                           Wound or open sores. Avoid contact with eyes, ears mouth and genitals (private parts).                       Wash face,  Genitals (private parts) with your normal soap.             6.  Wash thoroughly, paying special attention to the area where your surgery  will be performed.  7.  Thoroughly rinse your body with warm water from the neck down.  8.  DO NOT shower/wash with your normal soap after using and rinsing off  the CHG Soap.                9.  Pat yourself dry with a clean towel.            10.  Wear clean pajamas.            11.  Place clean sheets on your bed the night of your first shower and do not  sleep with pets. Day of Surgery : Do not apply any lotions/deodorants the morning of surgery.  Please wear clean clothes to the hospital/surgery center.  FAILURE TO FOLLOW THESE INSTRUCTIONS MAY RESULT IN THE CANCELLATION OF YOUR SURGERY PATIENT SIGNATURE_________________________________  NURSE SIGNATURE__________________________________  ________________________________________________________________________   Adam Phenix  An incentive spirometer is a tool that can help keep your lungs clear and active. This tool measures how well you are filling your lungs with each breath. Taking long deep breaths may help reverse or decrease the chance of developing breathing (pulmonary) problems (especially infection) following:  A long period of time when you are unable to move or be active. BEFORE THE PROCEDURE   If the spirometer includes an indicator to show your best effort, your nurse or respiratory therapist will set it to a desired goal.  If possible, sit up straight or lean slightly forward. Try not to slouch.  Hold the incentive spirometer in an upright position. INSTRUCTIONS FOR USE   Sit on the edge of your bed if possible, or sit up as far as you can in bed or on a chair.  Hold the incentive spirometer in an upright position.  Breathe out normally.  Place the mouthpiece in your mouth and seal your lips tightly around it.  Breathe in slowly and as deeply as possible, raising the piston or the ball toward the top of the column.  Hold your breath for 3-5 seconds or for as long as possible. Allow the piston or ball to fall to the bottom of the column.  Remove the mouthpiece from your mouth and breathe out normally.  Rest for a few seconds and repeat Steps 1 through 7 at least 10 times every 1-2 hours when you are awake. Take your time and take a few normal breaths between deep breaths.  The spirometer may include an indicator to show your best effort. Use the indicator as a goal to work toward during each repetition.  After each set of 10 deep breaths, practice coughing to be sure your lungs are clear. If you have an incision (the cut made at the time of surgery), support your incision when coughing by placing a pillow or rolled up towels firmly against it. Once you are able to get out of bed, walk around indoors and cough well. You may stop using the incentive spirometer when instructed by  your caregiver.  RISKS AND COMPLICATIONS  Take your time so you do not get dizzy or light-headed.  If you are in pain, you may need to take or ask for pain medication before doing incentive spirometry. It is harder to take a deep breath if you are having pain. AFTER USE  Rest and breathe slowly and easily.  It can be helpful to keep track of a log of your progress. Your caregiver can provide you with a simple table to help with this. If you are using the spirometer at home, follow these instructions: [place] IF:   You are having difficultly using the spirometer.  You have trouble using the spirometer as often as instructed.  Your pain medication is not giving enough relief while using the spirometer.  You develop fever of 100.5 F (38.1 C) or higher. SEEK IMMEDIATE MEDICAL CARE IF:   You cough up bloody sputum that had not been present before.  You develop fever of 102 F (38.9 C) or greater.  You develop worsening pain at or near the incision site. MAKE SURE YOU:   Understand these instructions.  Will watch your condition.  Will get help right away if you are not doing well or get worse. Document Released: 09/29/2006 Document Revised: 08/11/2011 Document Reviewed: 11/30/2006 Exit[employer] Patient Information 2014 [employer], [place].   ________________________________________________________________________  WHAT IS A BLOOD TRANSFUSION? Blood Transfusion Information  A transfusion is the replacement of blood or some of its parts. Blood is made up of multiple cells which provide different functions.  Red blood cells carry oxygen and are used for blood loss replacement.  White blood cells fight against infection.  Platelets control bleeding.  Plasma helps clot blood.  Other blood products are available for specialized needs, such as hemophilia or other clotting disorders. BEFORE THE TRANSFUSION  Who gives blood for transfusions?   Healthy volunteers who are  fully evaluated to make sure their blood is safe. This is blood bank blood. Transfusion therapy is the safest it has ever been in the practice of medicine. Before blood is taken from a donor, a complete history is taken to make sure that person has no history of diseases nor engages in risky social behavior (examples are intravenous drug use or sexual activity with multiple partners). The donor's travel history is screened to minimize risk of transmitting infections, such as malaria. The donated blood is tested for signs of infectious diseases, such as HIV and hepatitis. The blood is then tested to be sure it is compatible with you in order to minimize the chance of a transfusion reaction. If you or a relative donates blood, this is often done in anticipation of surgery and is not appropriate for emergency situations. It takes many days to process the donated blood. RISKS AND COMPLICATIONS Although transfusion therapy is very safe and saves many lives, the main dangers of transfusion include:   Getting an infectious disease.  Developing a transfusion reaction. This is an allergic reaction to something in the blood you were given. Every precaution is taken to prevent this. The decision  to have a blood transfusion has been considered carefully by your caregiver before blood is given. Blood is not given unless the benefits outweigh the risks. AFTER THE TRANSFUSION  Right after receiving a blood transfusion, you will usually feel much better and more energetic. This is especially true if your red blood cells have gotten low (anemic). The transfusion raises the level of the red blood cells which carry oxygen, and this usually causes an energy increase.  The nurse administering the transfusion will monitor you carefully for complications. HOME CARE INSTRUCTIONS  No special instructions are needed after a transfusion. You may find your energy is better. Speak with your caregiver about any limitations on  activity for underlying diseases you may have. SEEK MEDICAL CARE IF:   Your condition is not improving after your transfusion.  You develop redness or irritation at the intravenous (IV) site. SEEK IMMEDIATE MEDICAL CARE IF:  Any of the following symptoms occur over the next 12 hours:  Shaking chills.  You have a temperature by mouth above 102 F (38.9 C), not controlled by medicine.  Chest, back, or muscle pain.  People around you feel you are not acting correctly or are confused.  Shortness of breath or difficulty breathing.  Dizziness and fainting.  You get a rash or develop hives.  You have a decrease in urine output.  Your urine turns a dark color or changes to pink, red, or brown. Any of the following symptoms occur over the next 10 days:  You have a temperature by mouth above 102 F (38.9 C), not controlled by medicine.  Shortness of breath.  Weakness after normal activity.  The white part of the eye turns yellow (jaundice).  You have a decrease in the amount of urine or are urinating less often.  Your urine turns a dark color or changes to pink, red, or brown. Document Released: 05/16/2000 Document Revised: 08/11/2011 Document Reviewed: 01/03/2008 [facility] Patient Information 2014 [place], [place].  _______________________________________________________________________

## 2014-11-05 ENCOUNTER — Encounter (HOSPITAL_COMMUNITY): Payer: Self-pay | Admitting: Anesthesiology

## 2014-11-05 NOTE — Anesthesia Preprocedure Evaluation (Addendum)
Anesthesia Evaluation  Patient identified by MRN, date of birth, ID band Patient awake    Reviewed: Allergy & Precautions, NPO status , Patient's Chart, lab work & pertinent test results  History of Anesthesia Complications (+) PONV and history of anesthetic complications  Airway Mallampati: II  TM Distance: >3 FB Neck ROM: Full    Dental no notable dental hx.    Pulmonary shortness of breath, asthma ,  breath sounds clear to auscultation  Pulmonary exam normal       Cardiovascular negative cardio ROS Normal cardiovascular examRhythm:Regular Rate:Normal     Neuro/Psych negative neurological ROS  negative psych ROS   GI/Hepatic Neg liver ROS, GERD-  ,  Endo/Other  negative endocrine ROS  Renal/GU negative Renal ROS  negative genitourinary   Musculoskeletal  (+) Arthritis -,   Abdominal   Peds negative pediatric ROS (+)  Hematology negative hematology ROS (+)   Anesthesia Other Findings   Reproductive/Obstetrics negative OB ROS                             Anesthesia Physical Anesthesia Plan  ASA: II  Anesthesia Plan: Spinal   Post-op Pain Management:    Induction: Intravenous  Airway Management Planned:   Additional Equipment:   Intra-op Plan:   Post-operative Plan:   Informed Consent: I have reviewed the patients History and Physical, chart, labs and discussed the procedure including the risks, benefits and alternatives for the proposed anesthesia with the patient or authorized representative who has indicated his/her understanding and acceptance.   Dental advisory given  Plan Discussed with: CRNA  Anesthesia Plan Comments: (Discussed risks and benefits of and differences between spinal and general. Discussed risks of spinal including headache, backache, failure, bleeding, infection, and nerve damage. Patient consents to spinal. Questions answered. Coagulation studies and  platelet count acceptable.)       Anesthesia Quick Evaluation

## 2014-11-06 ENCOUNTER — Encounter (HOSPITAL_COMMUNITY)
Admission: RE | Disposition: A | Discharge status: Home / Self Care | Payer: Self-pay | Source: Ambulatory Visit | Attending: Orthopedic Surgery

## 2014-11-06 ENCOUNTER — Ambulatory Visit (HOSPITAL_COMMUNITY): Payer: Managed Care, Other (non HMO) | Admitting: Anesthesiology

## 2014-11-06 ENCOUNTER — Encounter (HOSPITAL_COMMUNITY): Payer: Self-pay | Admitting: *Deleted

## 2014-11-06 ENCOUNTER — Observation Stay (HOSPITAL_COMMUNITY)
Admission: RE | Admit: 2014-11-06 | Discharge: 2014-11-07 | Disposition: A | Discharge status: Home / Self Care | Payer: Managed Care, Other (non HMO) | Source: Ambulatory Visit | Attending: Orthopedic Surgery | Admitting: Orthopedic Surgery

## 2014-11-06 DIAGNOSIS — Z882 Allergy status to sulfonamides status: Secondary | ICD-10-CM | POA: Diagnosis not present

## 2014-11-06 DIAGNOSIS — Z96652 Presence of left artificial knee joint: Secondary | ICD-10-CM

## 2014-11-06 DIAGNOSIS — Z79899 Other long term (current) drug therapy: Secondary | ICD-10-CM | POA: Diagnosis not present

## 2014-11-06 DIAGNOSIS — Z96659 Presence of unspecified artificial knee joint: Secondary | ICD-10-CM

## 2014-11-06 DIAGNOSIS — K219 Gastro-esophageal reflux disease without esophagitis: Secondary | ICD-10-CM | POA: Insufficient documentation

## 2014-11-06 DIAGNOSIS — Z9049 Acquired absence of other specified parts of digestive tract: Secondary | ICD-10-CM | POA: Insufficient documentation

## 2014-11-06 DIAGNOSIS — J45909 Unspecified asthma, uncomplicated: Secondary | ICD-10-CM | POA: Diagnosis not present

## 2014-11-06 DIAGNOSIS — M1712 Unilateral primary osteoarthritis, left knee: Secondary | ICD-10-CM | POA: Diagnosis present

## 2014-11-06 DIAGNOSIS — Z91018 Allergy to other foods: Secondary | ICD-10-CM | POA: Diagnosis not present

## 2014-11-06 HISTORY — PX: PATELLA-FEMORAL ARTHROPLASTY: SHX5037

## 2014-11-06 LAB — TYPE AND SCREEN
ABO/RH(D): B NEG
ANTIBODY SCREEN: NEGATIVE

## 2014-11-06 SURGERY — ARTHROPLASTY, PATELLOFEMORAL
Anesthesia: Spinal | Site: Knee | Laterality: Left

## 2014-11-06 MED ORDER — ONDANSETRON HCL 4 MG/2ML IJ SOLN: 4.0000 mg | Freq: Four times a day (QID) | INTRAMUSCULAR | Status: DC | PRN

## 2014-11-06 MED ORDER — EPHEDRINE SULFATE 50 MG/ML IJ SOLN
INTRAMUSCULAR | Status: AC
  Filled 2014-11-06: qty 1

## 2014-11-06 MED ORDER — DIPHENHYDRAMINE HCL 25 MG PO TABS
25.0000 mg | ORAL_TABLET | Freq: Four times a day (QID) | ORAL | Status: DC | PRN
  Filled 2014-11-06: qty 1

## 2014-11-06 MED ORDER — SODIUM CHLORIDE 0.9 % IV SOLN
1000.0000 mg | Freq: Once | INTRAVENOUS | Status: AC
  Administered 2014-11-06: 1000 mg via INTRAVENOUS
  Filled 2014-11-06: qty 10

## 2014-11-06 MED ORDER — ALBUTEROL SULFATE (2.5 MG/3ML) 0.083% IN NEBU: 2.5000 mg | INHALATION_SOLUTION | Freq: Four times a day (QID) | RESPIRATORY_TRACT | Status: DC | PRN

## 2014-11-06 MED ORDER — ASPIRIN EC 325 MG PO TBEC
325.0000 mg | DELAYED_RELEASE_TABLET | Freq: Two times a day (BID) | ORAL | Status: DC
  Administered 2014-11-07: 325 mg via ORAL
  Filled 2014-11-06 (×3): qty 1

## 2014-11-06 MED ORDER — CEFAZOLIN SODIUM-DEXTROSE 2-3 GM-% IV SOLR
2.0000 g | INTRAVENOUS | Status: AC
  Administered 2014-11-06: 2 g via INTRAVENOUS

## 2014-11-06 MED ORDER — ONDANSETRON HCL 4 MG/2ML IJ SOLN
INTRAMUSCULAR | Status: DC | PRN
  Administered 2014-11-06: 4 mg via INTRAVENOUS

## 2014-11-06 MED ORDER — SODIUM CHLORIDE 0.9 % IJ SOLN
INTRAMUSCULAR | Status: AC
  Filled 2014-11-06: qty 50

## 2014-11-06 MED ORDER — ONDANSETRON HCL 4 MG PO TABS
4.0000 mg | ORAL_TABLET | Freq: Four times a day (QID) | ORAL | Status: DC | PRN
  Filled 2014-11-06: qty 1

## 2014-11-06 MED ORDER — CEFAZOLIN SODIUM-DEXTROSE 2-3 GM-% IV SOLR
INTRAVENOUS | Status: AC
  Filled 2014-11-06: qty 50

## 2014-11-06 MED ORDER — MAGNESIUM CITRATE PO SOLN
1.0000 | Freq: Once | ORAL | Status: AC | PRN
  Filled 2014-11-06: qty 296

## 2014-11-06 MED ORDER — BISACODYL 10 MG RE SUPP
10.0000 mg | Freq: Every day | RECTAL | Status: DC | PRN
  Filled 2014-11-06: qty 1

## 2014-11-06 MED ORDER — MENTHOL 3 MG MT LOZG: 1.0000 | LOZENGE | OROMUCOSAL | Status: DC | PRN

## 2014-11-06 MED ORDER — LIDOCAINE HCL (CARDIAC) 20 MG/ML IV SOLN
INTRAVENOUS | Status: AC
  Filled 2014-11-06: qty 5

## 2014-11-06 MED ORDER — HYDROMORPHONE HCL 1 MG/ML IJ SOLN: 0.5000 mg | INTRAMUSCULAR | Status: DC | PRN

## 2014-11-06 MED ORDER — SODIUM CHLORIDE 0.9 % IV SOLN
INTRAVENOUS | Status: DC
  Administered 2014-11-06: 13:00:00 via INTRAVENOUS
  Filled 2014-11-06 (×9): qty 1000

## 2014-11-06 MED ORDER — DOCUSATE SODIUM 100 MG PO CAPS
100.0000 mg | ORAL_CAPSULE | Freq: Two times a day (BID) | ORAL | Status: DC
  Administered 2014-11-06 – 2014-11-07 (×3): 100 mg via ORAL
  Filled 2014-11-06 (×4): qty 1

## 2014-11-06 MED ORDER — ALUM & MAG HYDROXIDE-SIMETH 200-200-20 MG/5ML PO SUSP
30.0000 mL | ORAL | Status: DC | PRN
  Filled 2014-11-06: qty 30

## 2014-11-06 MED ORDER — GLYCOPYRROLATE 0.2 MG/ML IJ SOLN
INTRAMUSCULAR | Status: AC
  Filled 2014-11-06: qty 1

## 2014-11-06 MED ORDER — FERROUS SULFATE 325 (65 FE) MG PO TABS
325.0000 mg | ORAL_TABLET | Freq: Three times a day (TID) | ORAL | Status: DC
  Administered 2014-11-06 – 2014-11-07 (×3): 325 mg via ORAL
  Filled 2014-11-06 (×6): qty 1

## 2014-11-06 MED ORDER — PHENOL 1.4 % MT LIQD: 1.0000 | OROMUCOSAL | Status: DC | PRN

## 2014-11-06 MED ORDER — POLYETHYLENE GLYCOL 3350 17 G PO PACK
17.0000 g | PACK | Freq: Two times a day (BID) | ORAL | Status: DC
  Administered 2014-11-06: 17 g via ORAL
  Filled 2014-11-06 (×4): qty 1

## 2014-11-06 MED ORDER — LIDOCAINE HCL (CARDIAC) 20 MG/ML IV SOLN
INTRAVENOUS | Status: DC | PRN
  Administered 2014-11-06: 60 mg via INTRAVENOUS
  Administered 2014-11-06: 40 mg via INTRAVENOUS

## 2014-11-06 MED ORDER — SODIUM CHLORIDE 0.9 % IJ SOLN
INTRAMUSCULAR | Status: AC
  Filled 2014-11-06: qty 10

## 2014-11-06 MED ORDER — LACTATED RINGERS IV SOLN
INTRAVENOUS | Status: DC | PRN
  Administered 2014-11-06 (×2): via INTRAVENOUS

## 2014-11-06 MED ORDER — METHOCARBAMOL 1000 MG/10ML IJ SOLN
500.0000 mg | Freq: Four times a day (QID) | INTRAVENOUS | Status: DC | PRN
  Administered 2014-11-06: 500 mg via INTRAVENOUS
  Filled 2014-11-06 (×2): qty 5

## 2014-11-06 MED ORDER — KETOROLAC TROMETHAMINE 30 MG/ML IJ SOLN
INTRAMUSCULAR | Status: AC
  Filled 2014-11-06: qty 1

## 2014-11-06 MED ORDER — ALPRAZOLAM 0.25 MG PO TABS: 0.2500 mg | ORAL_TABLET | Freq: Three times a day (TID) | ORAL | Status: DC | PRN

## 2014-11-06 MED ORDER — PROPOFOL INFUSION 10 MG/ML OPTIME
INTRAVENOUS | Status: DC | PRN
  Administered 2014-11-06: 75 ug/kg/min via INTRAVENOUS

## 2014-11-06 MED ORDER — BUPIVACAINE IN DEXTROSE 0.75-8.25 % IT SOLN
INTRATHECAL | Status: DC | PRN
  Administered 2014-11-06: 1.8 mL via INTRATHECAL

## 2014-11-06 MED ORDER — METHOCARBAMOL 500 MG PO TABS
500.0000 mg | ORAL_TABLET | Freq: Four times a day (QID) | ORAL | Status: DC | PRN
  Administered 2014-11-06 – 2014-11-07 (×2): 500 mg via ORAL
  Filled 2014-11-06 (×2): qty 1

## 2014-11-06 MED ORDER — FENTANYL CITRATE (PF) 100 MCG/2ML IJ SOLN
INTRAMUSCULAR | Status: DC | PRN
  Administered 2014-11-06: 50 ug via INTRAVENOUS
  Administered 2014-11-06 (×2): 25 ug via INTRAVENOUS

## 2014-11-06 MED ORDER — DEXAMETHASONE SODIUM PHOSPHATE 10 MG/ML IJ SOLN
10.0000 mg | Freq: Once | INTRAMUSCULAR | Status: AC
  Administered 2014-11-06: 10 mg via INTRAVENOUS

## 2014-11-06 MED ORDER — SODIUM CHLORIDE 0.9 % IJ SOLN
INTRAMUSCULAR | Status: DC | PRN
Start: 2014-11-06 — End: 2014-11-06
  Administered 2014-11-06: 29 mL

## 2014-11-06 MED ORDER — 0.9 % SODIUM CHLORIDE (POUR BTL) OPTIME
TOPICAL | Status: DC | PRN
  Administered 2014-11-06: 1000 mL

## 2014-11-06 MED ORDER — CHLORHEXIDINE GLUCONATE CLOTH 2 % EX PADS: 6.0000 | MEDICATED_PAD | Freq: Once | CUTANEOUS | Status: DC

## 2014-11-06 MED ORDER — PROPOFOL 10 MG/ML IV BOLUS
INTRAVENOUS | Status: AC
  Filled 2014-11-06: qty 20

## 2014-11-06 MED ORDER — MIDAZOLAM HCL 2 MG/2ML IJ SOLN
INTRAMUSCULAR | Status: AC
  Filled 2014-11-06: qty 2

## 2014-11-06 MED ORDER — DEXAMETHASONE SODIUM PHOSPHATE 10 MG/ML IJ SOLN
10.0000 mg | Freq: Once | INTRAMUSCULAR | Status: AC
  Administered 2014-11-07: 10 mg via INTRAVENOUS
  Filled 2014-11-06: qty 1

## 2014-11-06 MED ORDER — HYDROCODONE-ACETAMINOPHEN 7.5-325 MG PO TABS
1.0000 | ORAL_TABLET | ORAL | Status: DC
  Administered 2014-11-06: 1 via ORAL
  Administered 2014-11-06 (×2): 2 via ORAL
  Administered 2014-11-06: 1 via ORAL
  Administered 2014-11-07 (×3): 2 via ORAL
  Filled 2014-11-06 (×2): qty 2
  Filled 2014-11-06: qty 1
  Filled 2014-11-06 (×4): qty 2

## 2014-11-06 MED ORDER — HYDROMORPHONE HCL 1 MG/ML IJ SOLN: 0.2500 mg | INTRAMUSCULAR | Status: DC | PRN

## 2014-11-06 MED ORDER — METOCLOPRAMIDE HCL 5 MG PO TABS
5.0000 mg | ORAL_TABLET | Freq: Three times a day (TID) | ORAL | Status: DC | PRN
  Filled 2014-11-06: qty 2

## 2014-11-06 MED ORDER — BUPIVACAINE-EPINEPHRINE (PF) 0.25% -1:200000 IJ SOLN
INTRAMUSCULAR | Status: AC
  Filled 2014-11-06: qty 30

## 2014-11-06 MED ORDER — BUDESONIDE-FORMOTEROL FUMARATE 160-4.5 MCG/ACT IN AERO
2.0000 | INHALATION_SPRAY | Freq: Two times a day (BID) | RESPIRATORY_TRACT | Status: DC
  Administered 2014-11-06 – 2014-11-07 (×2): 2 via RESPIRATORY_TRACT
  Filled 2014-11-06: qty 6

## 2014-11-06 MED ORDER — SODIUM CHLORIDE 0.9 % IR SOLN
Status: DC | PRN
  Administered 2014-11-06: 1000 mL

## 2014-11-06 MED ORDER — CEFAZOLIN SODIUM-DEXTROSE 2-3 GM-% IV SOLR
2.0000 g | Freq: Four times a day (QID) | INTRAVENOUS | Status: AC
  Administered 2014-11-06 (×2): 2 g via INTRAVENOUS
  Filled 2014-11-06 (×2): qty 50

## 2014-11-06 MED ORDER — LORATADINE 10 MG PO TABS
10.0000 mg | ORAL_TABLET | Freq: Every day | ORAL | Status: DC
  Administered 2014-11-07: 10 mg via ORAL
  Filled 2014-11-06 (×2): qty 1

## 2014-11-06 MED ORDER — APREMILAST 30 MG PO TABS
30.0000 mg | ORAL_TABLET | Freq: Two times a day (BID) | ORAL | Status: DC
  Administered 2014-11-06 – 2014-11-07 (×2): 30 mg via ORAL

## 2014-11-06 MED ORDER — BUPIVACAINE-EPINEPHRINE (PF) 0.25% -1:200000 IJ SOLN
INTRAMUSCULAR | Status: DC | PRN
  Administered 2014-11-06: 30 mL

## 2014-11-06 MED ORDER — METOCLOPRAMIDE HCL 5 MG/ML IJ SOLN: 5.0000 mg | Freq: Three times a day (TID) | INTRAMUSCULAR | Status: DC | PRN

## 2014-11-06 MED ORDER — MIDAZOLAM HCL 5 MG/5ML IJ SOLN
INTRAMUSCULAR | Status: DC | PRN
Start: 2014-11-06 — End: 2014-11-06
  Administered 2014-11-06: 2 mg via INTRAVENOUS

## 2014-11-06 MED ORDER — FENTANYL CITRATE (PF) 100 MCG/2ML IJ SOLN
INTRAMUSCULAR | Status: AC
  Filled 2014-11-06: qty 2

## 2014-11-06 MED ORDER — KETOROLAC TROMETHAMINE 30 MG/ML IJ SOLN
INTRAMUSCULAR | Status: DC | PRN
Start: 2014-11-06 — End: 2014-11-06
  Administered 2014-11-06: 30 mg

## 2014-11-06 MED ORDER — FLUTICASONE PROPIONATE 50 MCG/ACT NA SUSP
1.0000 | Freq: Every day | NASAL | Status: DC
  Administered 2014-11-07: 1 via NASAL
  Filled 2014-11-06: qty 16

## 2014-11-06 SURGICAL SUPPLY — 60 items
BAG ZIPLOCK 12X15 (MISCELLANEOUS) IMPLANT
BANDAGE ELASTIC 6 VELCRO ST LF (GAUZE/BANDAGES/DRESSINGS) ×3 IMPLANT
BANDAGE ESMARK 6X9 LF (GAUZE/BANDAGES/DRESSINGS) ×1 IMPLANT
BLADE SAG 18X100X1.27 (BLADE) ×3 IMPLANT
BNDG ESMARK 6X9 LF (GAUZE/BANDAGES/DRESSINGS) ×3
BOWL SMART MIX CTS (DISPOSABLE) ×3 IMPLANT
BUR SURG PFJ MILL NEXGEN (Knees) ×1 IMPLANT
BURR SURG PFJ MILL NEXGEN (Knees) ×3 IMPLANT
CAP KNEE PARTIAL 2 ×1 IMPLANT
CAPT KNEE PARTIAL 2 ×2 IMPLANT
CEMENT HV SMART SET (Cement) ×3 IMPLANT
CUFF TOURN SGL QUICK 34 (TOURNIQUET CUFF) ×3
CUFF TRNQT CYL 34X4X40X1 (TOURNIQUET CUFF) ×1 IMPLANT
DRAPE EXTREMITY T 121X128X90 (DRAPE) ×3 IMPLANT
DRAPE POUCH INSTRU U-SHP 10X18 (DRAPES) ×3 IMPLANT
DRAPE U-SHAPE 47X51 STRL (DRAPES) ×3 IMPLANT
DRSG AQUACEL AG ADV 3.5X10 (GAUZE/BANDAGES/DRESSINGS) ×3 IMPLANT
DURAPREP 26ML APPLICATOR (WOUND CARE) ×6 IMPLANT
ELECT REM PT RETURN 9FT ADLT (ELECTROSURGICAL) ×3
ELECTRODE REM PT RTRN 9FT ADLT (ELECTROSURGICAL) ×1 IMPLANT
FACESHIELD WRAPAROUND (MASK) ×15 IMPLANT
GLOVE BIO SURGEON STRL SZ7.5 (GLOVE) ×3 IMPLANT
GLOVE BIOGEL PI IND STRL 6.5 (GLOVE) ×1 IMPLANT
GLOVE BIOGEL PI IND STRL 7.5 (GLOVE) ×3 IMPLANT
GLOVE BIOGEL PI IND STRL 8.5 (GLOVE) ×2 IMPLANT
GLOVE BIOGEL PI INDICATOR 6.5 (GLOVE) ×2
GLOVE BIOGEL PI INDICATOR 7.5 (GLOVE) ×6
GLOVE BIOGEL PI INDICATOR 8.5 (GLOVE) ×4
GLOVE ECLIPSE 8.0 STRL XLNG CF (GLOVE) ×3 IMPLANT
GLOVE ORTHO TXT STRL SZ7.5 (GLOVE) ×6 IMPLANT
GLOVE SURG SS PI 7.5 STRL IVOR (GLOVE) ×3 IMPLANT
GOWN BRE IMP PREV XXLGXLNG (GOWN DISPOSABLE) ×3 IMPLANT
GOWN SPEC L3 XXLG W/TWL (GOWN DISPOSABLE) ×3 IMPLANT
GOWN STRL REUS W/TWL LRG LVL3 (GOWN DISPOSABLE) ×3 IMPLANT
GOWN STRL REUS W/TWL XL LVL3 (GOWN DISPOSABLE) ×3 IMPLANT
HANDPIECE INTERPULSE COAX TIP (DISPOSABLE) ×2
KIT BASIN OR (CUSTOM PROCEDURE TRAY) ×3 IMPLANT
LIQUID BAND (GAUZE/BANDAGES/DRESSINGS) ×3 IMPLANT
MANIFOLD NEPTUNE II (INSTRUMENTS) ×3 IMPLANT
NDL SAFETY ECLIPSE 18X1.5 (NEEDLE) ×1 IMPLANT
NEEDLE HYPO 18GX1.5 SHARP (NEEDLE) ×2
PACK TOTAL JOINT (CUSTOM PROCEDURE TRAY) ×3 IMPLANT
PEN SKIN MARKING BROAD (MISCELLANEOUS) ×3 IMPLANT
POSITIONER SURGICAL ARM (MISCELLANEOUS) ×3 IMPLANT
SCREW HEADED 33MM KNEE (Screw) ×9 IMPLANT
SET HNDPC FAN SPRY TIP SCT (DISPOSABLE) ×1 IMPLANT
SET PAD KNEE POSITIONER (MISCELLANEOUS) ×3 IMPLANT
SUCTION FRAZIER 12FR DISP (SUCTIONS) ×3 IMPLANT
SUT MNCRL AB 4-0 PS2 18 (SUTURE) ×3 IMPLANT
SUT VIC AB 1 CT1 36 (SUTURE) ×3 IMPLANT
SUT VIC AB 2-0 CT1 27 (SUTURE) ×6
SUT VIC AB 2-0 CT1 TAPERPNT 27 (SUTURE) ×3 IMPLANT
SUT VLOC 180 0 24IN GS25 (SUTURE) ×3 IMPLANT
SYR 50ML LL SCALE MARK (SYRINGE) ×3 IMPLANT
TOWEL OR 17X26 10 PK STRL BLUE (TOWEL DISPOSABLE) ×3 IMPLANT
TOWEL OR NON WOVEN STRL DISP B (DISPOSABLE) ×3 IMPLANT
TRAY FOLEY W/METER SILVER 14FR (SET/KITS/TRAYS/PACK) ×3 IMPLANT
WATER STERILE IRR 1500ML POUR (IV SOLUTION) ×3 IMPLANT
WRAP KNEE MAXI GEL POST OP (GAUZE/BANDAGES/DRESSINGS) ×3 IMPLANT
YANKAUER SUCT BULB TIP NO VENT (SUCTIONS) ×3 IMPLANT

## 2014-11-06 NOTE — Interval H&P Note (Signed)
History and Physical Interval Note:  11/06/2014 6:41 AM  Ruth Charles  has presented today for surgery, with the diagnosis of left knee patella femoral osteoarthritis  The various methods of treatment have been discussed with the patient and family. After consideration of risks, benefits and other options for treatment, the patient has consented to  Procedure(s): LEFT KNEE PATELLA-FEMORAL ARTHROPLASTY (Left) as a surgical intervention .  The patient's history has been reviewed, patient examined, no change in status, stable for surgery.  I have reviewed the patient's chart and labs.  Questions were answered to the patient's satisfaction.     Shelda Pal

## 2014-11-06 NOTE — Transfer of Care (Signed)
Immediate Anesthesia Transfer of Care Note  Patient: Ruth Charles  Procedure(s) Performed: Procedure(s): LEFT KNEE PATELLA-FEMORAL ARTHROPLASTY (Left)  Patient Location: PACU  Anesthesia Type:Spinal  Level of Consciousness:  sedated, patient cooperative and responds to stimulation  Airway & Oxygen Therapy:Patient Spontanous Breathing and Patient connected to face mask oxgen  Post-op Assessment:  Report given to PACU RN and Post -op Vital signs reviewed and stable  Post vital signs:  Reviewed and stable  Last Vitals:  Filed Vitals:   11/06/14 0856  BP: 112/43  Pulse: 80  Temp:   Resp: 10    Complications: No apparent anesthesia complications

## 2014-11-06 NOTE — Evaluation (Signed)
Physical Therapy Evaluation Patient Details Name: Ruth Charles MRN: [id number] DOB: [birth year] Today's Date: 11/06/2014   History of Present Illness  L patella-femoral arthroplasty  Clinical Impression  Pt s/p L patella-femoral arthroplasty presents with decreased L LE strength/ROM and post op pain limiting functional mobility.  Pt should progress well to dc home with family assist and HHPT follow up.    Follow Up Recommendations Home health PT    Equipment Recommendations  Rolling walker with 5" wheels    Recommendations for Other Services OT consult     Precautions / Restrictions Precautions Precautions: Knee;Fall Restrictions Weight Bearing Restrictions: No Other Position/Activity Restrictions: WBAT      Mobility  Bed Mobility Overal bed mobility: Needs Assistance Bed Mobility: Supine to Sit;Sit to Supine     Supine to sit: Min assist Sit to supine: Min assist   General bed mobility comments: cues for sequence and use of R LE to self assist  Transfers Overall transfer level: Needs assistance Equipment used: Rolling walker (2 wheeled) Transfers: Sit to/from Stand Sit to Stand: Min assist         General transfer comment: cues for LE management and use of UEs to self assist  Ambulation/Gait Ambulation/Gait assistance: Min assist Ambulation Distance (Feet): 48 Feet Assistive device: Rolling walker (2 wheeled) Gait Pattern/deviations: Step-to pattern;Decreased step length - right;Decreased step length - left;Shuffle;Trunk flexed Gait velocity: decr   General Gait Details: cues for posture, sequence and position from [employer] Mobility    Modified Rankin (Stroke Patients Only)       Balance                                             Pertinent Vitals/Pain Pain Assessment: 0-10 Pain Score: 5  Pain Location: L knee Pain Descriptors / Indicators: Aching;Sore Pain Intervention(s): Limited activity  within patient's tolerance;Monitored during session;Premedicated before session;Ice applied    Home Living Family/patient expects to be discharged to:: Private residence Living Arrangements: Spouse/significant other Available Help at Discharge: Family Type of Home: House Home Access: Stairs to enter Entrance Stairs-Rails: None Secretary/administrator of Steps: 3 Home Layout: One level Home Equipment: None      Prior Function Level of Independence: Independent               Hand Dominance        Extremity/Trunk Assessment   Upper Extremity Assessment: Overall WFL for tasks assessed           Lower Extremity Assessment: LLE deficits/detail      Cervical / Trunk Assessment: Normal  Communication   Communication: No difficulties  Cognition Arousal/Alertness: Awake/alert Behavior During Therapy: WFL for tasks assessed/performed Overall Cognitive Status: Within Functional Limits for tasks assessed                      General Comments      Exercises Total Joint Exercises Ankle Circles/Pumps: AROM;Both;15 reps;Supine      Assessment/Plan    PT Assessment Patient needs continued PT services  PT Diagnosis Difficulty walking   PT Problem List Decreased strength;Decreased range of motion;Decreased activity tolerance;Decreased mobility;Decreased knowledge of use of DME;Pain  PT Treatment Interventions DME instruction;Gait training;Stair training;Functional mobility training;Therapeutic exercise;Therapeutic activities;Patient/family education   PT Goals (Current goals can be found in  the Care Plan section) Acute Rehab PT Goals Patient Stated Goal: Resume previous lifestyle with decreased pain PT Goal Formulation: With patient Time For Goal Achievement: 11/10/14 Potential to Achieve Goals: Good    Frequency 7X/week   Barriers to discharge        Co-evaluation               End of Session Equipment Utilized During Treatment: Gait  belt Activity Tolerance: Patient tolerated treatment well Patient left: with call bell/phone within reach;in bed;with family/visitor present Nurse Communication: Mobility status         Time: 4132-4401 PT Time Calculation (min) (ACUTE ONLY): 24 min   Charges:   PT Evaluation $Initial PT Evaluation Tier I: 1 Procedure PT Treatments $Gait Training: 8-22 mins   PT G Codes:        Ruth Charles December 04, 2014, 5:20 PM

## 2014-11-06 NOTE — Brief Op Note (Signed)
11/06/2014  8:30 AM  PATIENT:  Ruth Charles  44 y.o. female  PRE-OPERATIVE DIAGNOSIS:  left knee patella femoral osteoarthritis  POST-OPERATIVE DIAGNOSIS:  left knee patella femoral osteoarthritis  PROCEDURE:  Procedure(s): LEFT KNEE PATELLA-FEMORAL ARTHROPLASTY (Left)   Biomet - ZIMMER  SURGEON:  Surgeon(s) and Role:    * Durene Romans, MD - Primary  PHYSICIAN ASSISTANT: Lanney Gins, PA-C  ANESTHESIA:   spinal  EBL:  Total I/O In: 1000 [I.V.:1000] Out: 125 [Urine:100; Blood:25]  BLOOD ADMINISTERED:none  DRAINS: none   LOCAL MEDICATIONS USED:  MARCAINE     SPECIMEN:  No Specimen  DISPOSITION OF SPECIMEN:  N/A  COUNTS:  YES  TOURNIQUET:   Total Tourniquet Time Documented: Thigh (Left) - 28 minutes Total: Thigh (Left) - 28 minutes   DICTATION: .Other Dictation: Dictation Number [id number]  PLAN OF CARE: Admit for overnight observation  PATIENT DISPOSITION:  PACU - hemodynamically stable.   Delay start of Pharmacological VTE agent (>24hrs) due to surgical blood loss or risk of bleeding: no

## 2014-11-06 NOTE — Anesthesia Procedure Notes (Signed)
Spinal Patient location during procedure: OR Start time: 11/06/2014 7:15 AM End time: 11/06/2014 7:25 AM Staffing Resident/CRNA: Maree Ainley Performed by: resident/CRNA  Preanesthetic Checklist Completed: patient identified, site marked, surgical consent, pre-op evaluation, timeout performed, IV checked, risks and benefits discussed and monitors and equipment checked Spinal Block Patient position: sitting Prep: Betadine Patient monitoring: heart rate, cardiac monitor, continuous pulse ox and blood pressure Approach: midline Location: L3-4 Injection technique: single-shot Needle Needle type: Spinocan and Sprotte  Needle gauge: 24 G Needle length: 10 cm Needle insertion depth: 8 cm Assessment Sensory level: T6 Additional Notes -heme, -para, VSS.  Lot  [id number] Exp  [phone]

## 2014-11-06 NOTE — Op Note (Signed)
NAMEMarland Charles  MALAYSIA, CRANCE NO.:  [id number]  MEDICAL RECORD NO.:  [id number]  LOCATION:  1607                         FACILITY:  [facility]  PHYSICIAN:  Madlyn Frankel. Charlann Boxer, M.D.  DATE OF BIRTH:  1971/03/30  DATE OF PROCEDURE:  11/06/2014 DATE OF DISCHARGE:                              OPERATIVE REPORT   PREOPERATIVE DIAGNOSIS:  Left patellofemoral osteoarthritis failing conservative measures.  POSTOPERATIVE DIAGNOSIS:  Left patellofemoral osteoarthritis failing conservative measures.  PROCEDURE:  Left knee patellofemoral arthroplasty utilizing the Biomet Zimmer patellofemoral arthroplasty with patellofemoral replacement components with a size 2 left trochlear shield and a size 32 patella button.  SURGEON:  Madlyn Frankel. Charlann Boxer, M.D.  ASSISTANT:  Lanney Gins, PA-C.  Note that, Mr. Ruth Charles was present for the entirety of the case from preoperative position, perioperative management of the operative extremity, general facilitation of the case and primary wound closure.  ANESTHESIA:  Spinal.  SPECIMENS:  None.  COMPLICATIONS:  None.  DRAINS:  None.  TOURNIQUET TIME:  28 minutes at 250 mmHg.  INDICATIONS FOR PROCEDURE:  Ruth Charles is a 44 year old female who had been seen and evaluated in the office over the past year and a half or so with progressive worsening anterior knee symptoms.  She had failed conservative measures including physical therapy, injection therapy, activity modification, and anti-inflammatories.  She had persistent recurring symptoms with reduced quality of life based on that.  We had a lengthy discussion regarding continued observation versus surgical care in the form of arthroscopic surgery versus arthroplasty.  Arthroscopic risks, benefits and limitations were discussed in the setting of osteoarthritis.  She wished and elected at this point to proceed with arthroplasty for more definitive fix that long-term data not totally available for me to  review with her, she wished to proceed in this fashion.  Risks of infection, DVT, component failure, need for future revision surgery were all discussed and reviewed.  Consent was obtained for benefit of pain relief.  PROCEDURE IN DETAIL:  The patient was brought to the operative theater. Once adequate anesthesia, preoperative antibiotics, Ancef administered, the patient was positioned supine with a left thigh tourniquet placed. Left lower extremity was then prepped and draped in sterile fashion. Time-out was performed to identify the patient, planned procedure and extremity.  Leg was exsanguinated, tourniquet was elevated to 250 mmHg. Midline incision was made followed by soft tissue dissection.  Median arthrotomy was made sharply with the knife to preserve cartilage surfaces.  Following initial debridement, exposure, and synovectomy around the patella, attention was first directed to the patella itself. The patient was noted to have significant degenerative changes predominantly involving the medial facet of the patella with significant fragmentation and exposed bone involving the entire medial facet.  Using the caliper, we measured a precut measurement about 21 mm.  I resected down to 14 mm, used a 32 patellar button to restore patellar height.  Lug holes were drilled and a plastic button placed to protect the patella from saw blades and retractors.  At this point, attention was now directed to the femur.  With the femur slightly flex, intramedullary canal was opened with a drill, irrigated to try to prevent fat emboli.  An intramedullary guide was then positioned and the femoral cutting block oriented over the anterior femur and rotated to match Whitesides line as well as oriented along the epicondylar axis.  It was then pinned into position and anterior distal femoral cut made.  Once this was made, I looked several components and elected to use size 2 trochlear shield based  on the coverage provided without overhang.  The size 2 left Milling guide was then pinned into place on the anterior cut of the femur.  The Zimmer Milling bur was then used to create the cut surface for the trochlear shield.  Final drill holes were made for the 3 lug holes and central peg.  Trial reduction was now carried out with two left trochlear shields in the 32 patellar button.  The patella was noted to track through the trochlea without evidence of any instability, mechanical symptoms and/or lift-off tilting.  Given these findings, the trial components were removed.  We injected the synovial capsule junction in the knee with 30 mL of 0.25% Marcaine with epinephrine, 1 mL of Toradol and 30 mL of normal saline.  The knee was then irrigated with normal saline solution.  Final components were opened and cement mixed.  Final components were then cemented into place and held into position while the cement fully cured.  Once the cement cured, excess cement was removed throughout the knee.  Once I re-trialed the knee and was pleased with the overall result, the knee was brought to 60 degrees of flexion. The extensor mechanism was then reapproximated using #1 Vicryl and 0 V- Loc suture.  The remainder of the wound was closed with 2-0 Vicryl and 4- 0 running Monocryl.  The knee was cleaned, dried and dressed sterilely using surgical glue, Aquacel dressing and Ace wrap.  The patient was brought to the recovery room in stable condition with the plan for stay overnight for observation and home with physical therapy.     Madlyn Frankel Charlann Boxer, M.D.     MDO/MEDQ  D:  11/06/2014  T:  11/06/2014  Job:  [id number]

## 2014-11-06 NOTE — Anesthesia Postprocedure Evaluation (Signed)
  Anesthesia Post-op Note  Patient: Ruth Charles  Procedure(s) Performed: Procedure(s) (LRB): LEFT KNEE PATELLA-FEMORAL ARTHROPLASTY (Left)  Patient Location: PACU  Anesthesia Type: Spinal  Level of Consciousness: awake and alert   Airway and Oxygen Therapy: Patient Spontanous Breathing  Post-op Pain: mild  Post-op Assessment: Post-op Vital signs reviewed, Patient's Cardiovascular Status Stable, Respiratory Function Stable, Patent Airway and No signs of Nausea or vomiting  Last Vitals:  Filed Vitals:   11/06/14 1310  BP: 137/74  Pulse: 93  Temp: 36.9 C  Resp: 18    Post-op Vital Signs: stable   Complications: No apparent anesthesia complications

## 2014-11-07 ENCOUNTER — Encounter (HOSPITAL_COMMUNITY): Payer: Self-pay | Admitting: Orthopedic Surgery

## 2014-11-07 DIAGNOSIS — M1712 Unilateral primary osteoarthritis, left knee: Secondary | ICD-10-CM | POA: Diagnosis not present

## 2014-11-07 LAB — CBC
HCT: 34.5 % — ABNORMAL LOW (ref 36.0–46.0)
Hemoglobin: 11.4 g/dL — ABNORMAL LOW (ref 12.0–15.0)
MCH: 35.7 pg — ABNORMAL HIGH (ref 26.0–34.0)
MCHC: 33 g/dL (ref 30.0–36.0)
MCV: 108.2 fL — AB (ref 78.0–100.0)
Platelets: 242 10*3/uL (ref 150–400)
RBC: 3.19 MIL/uL — ABNORMAL LOW (ref 3.87–5.11)
RDW: 13.8 % (ref 11.5–15.5)
WBC: 13.8 10*3/uL — AB (ref 4.0–10.5)

## 2014-11-07 LAB — BASIC METABOLIC PANEL
Anion gap: 7 (ref 5–15)
BUN: 9 mg/dL (ref 6–20)
CO2: 21 mmol/L — ABNORMAL LOW (ref 22–32)
CREATININE: 0.55 mg/dL (ref 0.44–1.00)
Calcium: 8.1 mg/dL — ABNORMAL LOW (ref 8.9–10.3)
Chloride: 106 mmol/L (ref 101–111)
GFR calc Af Amer: >60 mL/min (ref >60)
GLUCOSE: 126 mg/dL — AB (ref 65–99)
Potassium: 3.6 mmol/L (ref 3.5–5.1)
SODIUM: 134 mmol/L — AB (ref 135–145)

## 2014-11-07 MED ORDER — ASPIRIN 325 MG PO TBEC
325.0000 mg | DELAYED_RELEASE_TABLET | Freq: Two times a day (BID) | ORAL | Status: AC
Start: 2014-11-07 — End: 2014-12-05

## 2014-11-07 MED ORDER — DOCUSATE SODIUM 100 MG PO CAPS: 100.0000 mg | ORAL_CAPSULE | Freq: Two times a day (BID) | ORAL | Status: DC

## 2014-11-07 MED ORDER — FERROUS SULFATE 325 (65 FE) MG PO TABS: 325.0000 mg | ORAL_TABLET | Freq: Three times a day (TID) | ORAL | Status: DC

## 2014-11-07 MED ORDER — METHOCARBAMOL 500 MG PO TABS: 500.0000 mg | ORAL_TABLET | Freq: Four times a day (QID) | ORAL | Status: DC | PRN

## 2014-11-07 MED ORDER — HYDROCODONE-ACETAMINOPHEN 7.5-325 MG PO TABS: 1.0000 | ORAL_TABLET | ORAL | Status: DC | PRN

## 2014-11-07 MED ORDER — POLYETHYLENE GLYCOL 3350 17 G PO PACK: 17.0000 g | PACK | Freq: Two times a day (BID) | ORAL | Status: DC

## 2014-11-07 NOTE — Care Management Note (Signed)
Case Management Note  Patient Details  Name: Ruth Charles MRN: [id number] Date of Birth: 1971-05-27  Subjective/Objective:                    LEFT KNEE PATELLA-FEMORAL ARTHROPLASTY (Left) Action/Plan:  Discharge planning Expected Discharge Date:  11/07/14               Expected Discharge Plan:  [place]  In-House Referral:     Discharge planning Services  CM Consult  Post Acute Care Choice:  Home Health Choice offered to:  Patient  DME Arranged:  3-N-1, Walker rolling DME Agency:  [place]:  PT [place]:  [place]  Status of Service:  Completed, signed off  Medicare Important Message Given:    Date Medicare IM Given:    Medicare IM give by:    Date Additional Medicare IM Given:    Additional Medicare Important Message give by:     If discussed at [place] of Stay Meetings, dates discussed:    Additional Comments: CM met with pt in room to offer choice of home health agency.  Pt chooses [facility] to render HHPT.  Address and contact information verified by pt and additional contact is Gerald Stabs [phone].  Referral and request for 3n1 and rolling walker to be delivered to room so pt can discharge was called to [facility] rep, Kristen.  No other CM needs were communicated. Dellie Catholic, RN 11/07/2014, 9:13 AM

## 2014-11-07 NOTE — Progress Notes (Signed)
Physical Therapy Treatment Patient Details Name: Ruth Charles MRN: [id number] DOB: August 28, 1970 Today's Date: 11/07/2014    History of Present Illness L patella-femoral arthroplasty    PT Comments    POD # 1 am session.  Spouse and mother present.  Practiced stairs up back ward with walker due to no rails.  Educated and demonstrated.  Assisted with gait training and educated on use of ICE.   Follow Up Recommendations  Home health PT     Equipment Recommendations  Rolling walker with 5" wheels;3in1 (PT)    Recommendations for Other Services       Precautions / Restrictions Precautions Precautions: None Restrictions Weight Bearing Restrictions: No Other Position/Activity Restrictions: WBAT    Mobility  Bed Mobility               General bed mobility comments: Pt OOB in recliner  Transfers Overall transfer level: Needs assistance Equipment used: None Transfers: Sit to/from Stand Sit to Stand: Supervision         General transfer comment: good safety cognition and use of hands  Ambulation/Gait Ambulation/Gait assistance: Min guard;Supervision Ambulation Distance (Feet): 22 Feet Assistive device: Rolling walker (2 wheeled) Gait Pattern/deviations: Step-to pattern;Trunk flexed Gait velocity: decreased   General Gait Details: cues for posture, sequence and position from RW   Stairs Stairs: Yes Stairs assistance: Min assist Stair Management: No rails;Step to pattern;Backwards;With walker Number of Stairs: 4 General stair comments: with spouse and mother.  Performed and demonstarted as well as handout given.   Wheelchair Mobility    Modified Rankin (Stroke Patients Only)       Balance                                    Cognition Arousal/Alertness: Awake/alert Behavior During Therapy: WFL for tasks assessed/performed Overall Cognitive Status: Within Functional Limits for tasks assessed                      Exercises       General Comments        Pertinent Vitals/Pain Pain Assessment: 0-10 Pain Score: 3  Pain Location: L knee Pain Descriptors / Indicators: Sore Pain Intervention(s): Monitored during session;Repositioned;Ice applied    Home Living                      Prior Function            PT Goals (current goals can now be found in the care plan section) Progress towards PT goals: Progressing toward goals    Frequency  7X/week    PT Plan      Co-evaluation             End of Session Equipment Utilized During Treatment: Gait belt Activity Tolerance: Patient tolerated treatment well Patient left: with call bell/phone within reach;with family/visitor present;in chair     Time: 0630-1601 PT Time Calculation (min) (ACUTE ONLY): 26 min  Charges:  $Gait Training: 8-22 mins $Therapeutic Activity: 8-22 mins                    G Codes:      Felecia Shelling  PTA [facility]  Acute  Rehab Pager      [phone]

## 2014-11-07 NOTE — Progress Notes (Signed)
Patient ID: Ruth Charles, female   DOB: May 18, 1971, 44 y.o.   MRN: [id number] Subjective: 1 Day Post-Op Procedure(s) (LRB): LEFT KNEE PATELLA-FEMORAL ARTHROPLASTY (Left)    Patient reports pain as mild.  Not much sleep though last night  Objective:   VITALS:   Filed Vitals:   11/07/14 0444  BP: 136/85  Pulse: 72  Temp: 98.2 F (36.8 C)  Resp: 18    Neurovascular intact Incision: dressing C/D/I  LABS  Recent Labs  11/07/14 0430  HGB 11.4*  HCT 34.5*  WBC 13.8*  PLT 242     Recent Labs  11/07/14 0430  NA 134*  K 3.6  BUN 9  CREATININE 0.55  GLUCOSE 126*    No results for input(s): LABPT, INR in the last 72 hours.   Assessment/Plan: 1 Day Post-Op Procedure(s) (LRB): LEFT KNEE PATELLA-FEMORAL ARTHROPLASTY (Left)   Advance diet Up with therapy Discharge home today after therapy

## 2014-11-07 NOTE — Evaluation (Signed)
Occupational Therapy Evaluation Patient Details Name: Ruth Charles MRN: [id number] DOB: [birth year] Today's Date: 11/07/2014    History of Present Illness L patella-femoral arthroplasty   Clinical Impression   Education complete regarding ADL activity s/p knee surgery    Follow Up Recommendations  No OT follow up          Precautions / Restrictions Precautions Precautions: Knee;Fall Restrictions Weight Bearing Restrictions: No Other Position/Activity Restrictions: WBAT      Mobility Bed Mobility Overal bed mobility: Needs Assistance Bed Mobility: Supine to Sit     Supine to sit: Supervision Sit to supine: Supervision   General bed mobility comments: cues for sequence and use of R LE to self assist  Transfers Overall transfer level: Needs assistance Equipment used: Rolling walker (2 wheeled) Transfers: Sit to/from Stand Sit to Stand: Supervision         General transfer comment: cues for LE management and use of UEs to self assist         ADL Overall ADL's : Needs assistance/impaired                                     Functional mobility during ADLs: Supervision/safety;Rolling walker General ADL Comments: Pt overall S with ADL activity including dressing.  Pt will have a at home as neeeded. Education complete regarding ADL activity s/p surgery               Pertinent Vitals/Pain Pain Score: 4  Pain Location: l knee Pain Descriptors / Indicators: Sore Pain Intervention(s): Limited activity within patient's tolerance;Monitored during session;Repositioned     Hand Dominance     Extremity/Trunk Assessment Upper Extremity Assessment Upper Extremity Assessment: Overall WFL for tasks assessed           Communication Communication Communication: No difficulties   Cognition Arousal/Alertness: Awake/alert Behavior During Therapy: WFL for tasks assessed/performed Overall Cognitive Status: Within Functional Limits for tasks  assessed                                Home Living Family/patient expects to be discharged to:: Private residence Living Arrangements: Spouse/significant other Available Help at Discharge: Family Type of Home: House Home Access: Stairs to enter Secretary/administrator of Steps: 3 Entrance Stairs-Rails: None Home Layout: One level     Bathroom Shower/Tub: Producer, television/film/video: Standard     Home Equipment: None          Prior Functioning/Environment Level of Independence: Independent                      OT Goals(Current goals can be found in the care plan section) Acute Rehab OT Goals Patient Stated Goal: Resume previous lifestyle with decreased pain  OT Frequency:                End of Session    Activity Tolerance: Patient tolerated treatment well Patient left:     Time: 1000-1010 OT Time Calculation (min): 10 min Charges:  OT General Charges $OT Visit: 1 Procedure OT Evaluation $Initial OT Evaluation Tier I: 1 Procedure G-Codes: OT G-codes **NOT FOR INPATIENT CLASS** Functional Assessment Tool Used: clinical observation Functional Limitation: Self care Self Care Current Status (W0981): At least 1 percent but less than 20 percent impaired, limited or restricted Self Care Goal Status (X9147): 0  percent impaired, limited or restricted Self Care Discharge Status [phone]): At least 1 percent but less than 20 percent impaired, limited or restricted  Ruth Charles, Ruth Charles 11/07/2014, 10:13 AM

## 2014-11-07 NOTE — Discharge Instructions (Signed)

## 2014-11-13 NOTE — Discharge Summary (Signed)
Physician Discharge Summary  Patient ID: Ruth Charles MRN: [id number] DOB/AGE: Jul 24, 1970 44 y.o.  Admit date: 11/06/2014 Discharge date: 11/07/2014   Procedures:  Procedure(s) (LRB): LEFT KNEE PATELLA-FEMORAL ARTHROPLASTY (Left)  Attending Physician:  Dr. Durene Romans   Admission Diagnoses:   Left knee patellofemoral compartmental primary OA /pain  Discharge Diagnoses:  Principal Problem:   S/P left knee PF replacement Active Problems:   Status post unilateral knee replacement  Past Medical History  Diagnosis Date  . Complication of anesthesia   . PONV (postoperative nausea and vomiting)   . Tinnitus   . Asthma   . Bronchitis     hx of   . GERD (gastroesophageal reflux disease)   . Arthritis   . Mumps     hx of   . Streptococcal infection     hx of   . Night sweats   . Fatigue   . Shortness of breath dyspnea   . Seasonal allergies   . Wheezing   . Cough   . Diarrhea   . Frequent urination at night   . Lactose intolerance   . Gluten intolerance     HPI:    Ruth Charles, 44 y.o. female female, has a history of pain and functional disability in the left and has failed non-surgical conservative treatments for greater than 12 weeks to include NSAID's and/or analgesics, corticosteriod injections, viscosupplementation injections and activity modification. Onset of symptoms was gradual, starting years ago with gradually worsening course since that time. The patient noted no past surgery on the left knee(s). Patient currently rates pain in the left knee(s) at 9 out of 10 with activity. Patient has night pain, worsening of pain with activity and weight bearing, pain that interferes with activities of daily living, pain with passive range of motion, crepitus and joint swelling. Patient has evidence of periarticular osteophytes, joint space narrowing and almost complete loss of cartilage of the patellofemoral compartment by imaging studies. There is no active  infection. Risks, benefits and expectations were discussed with the patient. Risks including but not limited to the risk of anesthesia, blood clots, nerve damage, blood vessel damage, failure of the prosthesis, infection and up to and including death. Patient understand the risks, benefits and expectations and wishes to proceed with surgery.   PCP: Astrid Divine, MD   Discharged Condition: good  Hospital Course:  Patient underwent the above stated procedure on 11/06/2014. Patient tolerated the procedure well and brought to the recovery room in good condition and subsequently to the floor.  POD #1 BP: 136/85 ; Pulse: 72 ; Temp: 98.2 F (36.8 C) ; Resp: 18 Patient reports pain as mild. Not much sleep though last night.  Ready to be discharged home. Dorsiflexion/plantar flexion intact, incision: dressing C/D/I, no cellulitis present and compartment soft.   LABS  Basename    HGB  11.4  HCT  34.5    Discharge Exam: General appearance: alert, cooperative and no distress Extremities: Homans sign is negative, no sign of DVT, no edema, redness or tenderness in the calves or thighs and no ulcers, gangrene or trophic changes  Disposition: Home with follow up in 2 weeks   Follow-up Information    Follow up with [facility].   Why:  home health physical therapy and 3n1 and rolling walker   Contact information:   [address] [place] [address] [phone]       Follow up with Shelda Pal, MD. Schedule an appointment as  soon as possible for a visit in 2 weeks.   Specialty:  Orthopedic Surgery   Contact information:   [address] [place] [address] [phone]       Discharge Instructions    Call MD / Call 911    Complete by:  As directed   If you experience chest pain or shortness of breath, CALL 911 and be transported to the hospital emergency room.  If you develope a fever above 101 F, pus (white drainage) or  increased drainage or redness at the wound, or calf pain, call your surgeon's office.     Change dressing    Complete by:  As directed   Maintain surgical dressing until follow up in the clinic. If the edges start to pull up, may reinforce with tape. If the dressing is no longer working, may remove and cover with gauze and tape, but must keep the area dry and clean.  Call with any questions or concerns.     Constipation Prevention    Complete by:  As directed   Drink plenty of fluids.  Prune juice may be helpful.  You may use a stool softener, such as Colace (over the counter) 100 mg twice a day.  Use MiraLax (over the counter) for constipation as needed.     Diet - low sodium heart healthy    Complete by:  As directed      Discharge instructions    Complete by:  As directed   Maintain surgical dressing until follow up in the clinic. If the edges start to pull up, may reinforce with tape. If the dressing is no longer working, may remove and cover with gauze and tape, but must keep the area dry and clean.  Follow up in 2 weeks at [facility]. Call with any questions or concerns.     Increase activity slowly as tolerated    Complete by:  As directed      TED hose    Complete by:  As directed   Use stockings (TED hose) for 2 weeks on both leg(s).  You may remove them at night for sleeping.     Weight bearing as tolerated    Complete by:  As directed   Laterality:  left  Extremity:  Lower             Medication List    STOP taking these medications        diclofenac 75 MG EC tablet  Commonly known as:  VOLTAREN      TAKE these medications        albuterol 108 (90 BASE) MCG/ACT inhaler  Commonly known as:  PROVENTIL HFA;VENTOLIN HFA  Inhale 2 puffs into the lungs every 6 (six) hours as needed for wheezing or shortness of breath.     ALPRAZolam 0.25 MG tablet  Commonly known as:  XANAX  Take 0.25 mg by mouth 3 (three) times daily as needed for anxiety.      Apremilast 30 MG Tabs  Take 30 mg by mouth 2 (two) times daily.     aspirin 325 MG EC tablet  Take 1 tablet (325 mg total) by mouth 2 (two) times daily.     budesonide-formoterol 160-4.5 MCG/ACT inhaler  Commonly known as:  SYMBICORT  Inhale 2 puffs into the lungs 2 (two) times daily.     cetirizine 10 MG tablet  Commonly known as:  ZYRTEC  Take 10 mg by mouth daily.  diphenhydrAMINE 25 MG tablet  Commonly known as:  BENADRYL  Take 25 mg by mouth every 6 (six) hours as needed.     docusate sodium 100 MG capsule  Commonly known as:  COLACE  Take 1 capsule (100 mg total) by mouth 2 (two) times daily.     ferrous sulfate 325 (65 FE) MG tablet  Take 1 tablet (325 mg total) by mouth 3 (three) times daily after meals.     fluticasone 50 MCG/ACT nasal spray  Commonly known as:  FLONASE  Place 1 spray into both nostrils daily.     HYDROcodone-acetaminophen 7.5-325 MG per tablet  Commonly known as:  NORCO  Take 1-2 tablets by mouth every 4 (four) hours as needed for moderate pain.     loratadine 10 MG tablet  Commonly known as:  CLARITIN  Take 10 mg by mouth daily.     methocarbamol 500 MG tablet  Commonly known as:  ROBAXIN  Take 1 tablet (500 mg total) by mouth every 6 (six) hours as needed for muscle spasms.     norethindrone-ethinyl estradiol-iron 1.5-30 MG-MCG tablet  Commonly known as:  MICROGESTIN FE,GILDESS FE,LOESTRIN FE  Take 1 tablet by mouth as directed. Takes everyday for 3 weeks then 1 week off     polyethylene glycol packet  Commonly known as:  MIRALAX / GLYCOLAX  Take 17 g by mouth 2 (two) times daily.         Signed: Anastasio Auerbach. Falana Clagg   PA-C  11/13/2014, 9:56 AM

## 2015-11-28 ENCOUNTER — Other Ambulatory Visit: Payer: Self-pay | Admitting: Family Medicine

## 2015-11-28 ENCOUNTER — Other Ambulatory Visit (HOSPITAL_COMMUNITY)
Admission: RE | Admit: 2015-11-28 | Discharge: 2015-11-28 | Disposition: A | Discharge status: Home / Self Care | Payer: Managed Care, Other (non HMO) | Source: Ambulatory Visit | Attending: Family Medicine | Admitting: Family Medicine

## 2015-11-28 DIAGNOSIS — Z124 Encounter for screening for malignant neoplasm of cervix: Secondary | ICD-10-CM | POA: Insufficient documentation

## 2015-12-03 LAB — CYTOLOGY - PAP

## 2015-12-12 ENCOUNTER — Other Ambulatory Visit: Payer: Self-pay | Admitting: Radiology

## 2016-03-10 ENCOUNTER — Encounter (HOSPITAL_COMMUNITY): Payer: Self-pay | Admitting: Emergency Medicine

## 2016-03-10 DIAGNOSIS — J4521 Mild intermittent asthma with (acute) exacerbation: Secondary | ICD-10-CM | POA: Insufficient documentation

## 2016-03-10 DIAGNOSIS — J029 Acute pharyngitis, unspecified: Secondary | ICD-10-CM | POA: Diagnosis not present

## 2016-03-10 DIAGNOSIS — Z79899 Other long term (current) drug therapy: Secondary | ICD-10-CM | POA: Insufficient documentation

## 2016-03-10 DIAGNOSIS — R0602 Shortness of breath: Secondary | ICD-10-CM | POA: Diagnosis present

## 2016-03-10 DIAGNOSIS — R112 Nausea with vomiting, unspecified: Secondary | ICD-10-CM | POA: Insufficient documentation

## 2016-03-10 MED ORDER — ALBUTEROL SULFATE (2.5 MG/3ML) 0.083% IN NEBU
5.0000 mg | INHALATION_SOLUTION | Freq: Once | RESPIRATORY_TRACT | Status: AC
  Administered 2016-03-10: 5 mg via RESPIRATORY_TRACT
  Filled 2016-03-10: qty 6

## 2016-03-10 NOTE — ED Triage Notes (Signed)
Pt states that she has had asthma attacks twice today. States that she has not been able to get it under control with her rescue inhalers and regular inhalers Alert and oriented N/V.

## 2016-03-11 ENCOUNTER — Emergency Department (HOSPITAL_COMMUNITY)
Admission: EM | Admit: 2016-03-11 | Discharge: 2016-03-11 | Disposition: A | Discharge status: Home / Self Care | Payer: Managed Care, Other (non HMO) | Attending: Emergency Medicine | Admitting: Emergency Medicine

## 2016-03-11 ENCOUNTER — Emergency Department (HOSPITAL_COMMUNITY): Payer: Managed Care, Other (non HMO)

## 2016-03-11 DIAGNOSIS — R1110 Vomiting, unspecified: Secondary | ICD-10-CM

## 2016-03-11 DIAGNOSIS — J45901 Unspecified asthma with (acute) exacerbation: Secondary | ICD-10-CM

## 2016-03-11 DIAGNOSIS — J029 Acute pharyngitis, unspecified: Secondary | ICD-10-CM

## 2016-03-11 MED ORDER — PREDNISONE 20 MG PO TABS: 40.0000 mg | ORAL_TABLET | Freq: Every day | ORAL | 0 refills | Status: DC

## 2016-03-11 MED ORDER — GI COCKTAIL ~~LOC~~
30.0000 mL | Freq: Once | ORAL | Status: AC
  Administered 2016-03-11: 30 mL via ORAL
  Filled 2016-03-11: qty 30

## 2016-03-11 MED ORDER — OMEPRAZOLE 20 MG PO CPDR: 20.0000 mg | DELAYED_RELEASE_CAPSULE | Freq: Every day | ORAL | 0 refills | Status: DC

## 2016-03-11 MED ORDER — PREDNISONE 20 MG PO TABS
60.0000 mg | ORAL_TABLET | Freq: Once | ORAL | Status: AC
  Administered 2016-03-11: 60 mg via ORAL
  Filled 2016-03-11: qty 3

## 2016-03-11 MED ORDER — ONDANSETRON 8 MG PO TBDP
8.0000 mg | ORAL_TABLET | Freq: Once | ORAL | Status: AC
  Administered 2016-03-11: 8 mg via ORAL
  Filled 2016-03-11: qty 1

## 2016-03-11 NOTE — ED Provider Notes (Signed)
[facility]-EMERGENCY DEPT Provider Note   CSN: [id number] Arrival date & time: 03/10/16  2336  By signing my name below, I, Phillis Haggis, attest that this documentation has been prepared under the direction and in the presence of Dahlia Client Janneth Krasner PA-C Electronically Signed: Phillis Haggis, ED Scribe. 03/11/16. 12:29 AM.  History   Chief Complaint Chief Complaint  Patient presents with  . Asthma   The history is provided by the patient. No language interpreter was used.   HPI Comments: Ruth Charles is a 45 y.o. female with a hx of asthma, SOB dyspnea, wheezing, seasonal allergies, and bronchitis who presents to the Emergency Department complaining of gradually worsening SOB onset 7 hours ago, worsening significantly two hours ago. She reports associated cough, wheezing, nausea, post-tussive emesis, and sore throat. Pt has a foreign body sensation in her throat. Pt reports two asthma attacks today. She has not found relief with her regular and rescue inhalers. Pt denies abdominal pain but says she felt a GERD like sensation in her epigastric region. Pt has been experiencing worsening cough over the past few days. She says that she had [place] food today that may have started her symptoms. She reports hx of food intolerances. Pt has had significant asthma over the past 6 years, But has never been intubated or hospitalized. Pt takes generic Claritin daily. She denies hx of smoking, hospitalization or intubation for asthma. Pt has been taking Cosentyx since July 2017. Pt denies fever or chills. Pt is allergic to Sulfa antibiotics.   Past Medical History:  Diagnosis Date  . Arthritis   . Asthma   . Bronchitis    hx of   . Complication of anesthesia   . Cough   . Diarrhea   . Fatigue   . Frequent urination at night   . GERD (gastroesophageal reflux disease)   . Gluten intolerance   . Lactose intolerance   . Mumps    hx of   . Night sweats   . PONV (postoperative nausea and  vomiting)   . Seasonal allergies   . Shortness of breath dyspnea   . Streptococcal infection    hx of   . Tinnitus   . Wheezing     Patient Active Problem List   Diagnosis Date Noted  . S/P left knee PF replacement 11/06/2014  . Status post unilateral knee replacement 11/06/2014    Past Surgical History:  Procedure Laterality Date  . APPENDECTOMY     2004  . BREAST SURGERY     breast biopsy left   . PATELLA-FEMORAL ARTHROPLASTY Left 11/06/2014   Procedure: LEFT KNEE PATELLA-FEMORAL ARTHROPLASTY;  Surgeon: Durene Romans, MD;  Location: [facility] ORS;  Service: Orthopedics;  Laterality: Left;  . removal right ovary with cyst     2004  . right TMJ     2002    OB History    No data available     Home Medications    Prior to Admission medications   Medication Sig Start Date End Date Taking? Authorizing Provider  albuterol (PROVENTIL HFA;VENTOLIN HFA) 108 (90 BASE) MCG/ACT inhaler Inhale 2 puffs into the lungs every 6 (six) hours as needed for wheezing or shortness of breath.     Historical Provider, MD  ALPRAZolam Prudy Feeler) 0.25 MG tablet Take 0.25 mg by mouth 3 (three) times daily as needed for anxiety.    Historical Provider, MD  Apremilast 30 MG TABS Take 30 mg by mouth 2 (two) times daily.    Historical Provider,  MD  budesonide-formoterol (SYMBICORT) 160-4.5 MCG/ACT inhaler Inhale 2 puffs into the lungs 2 (two) times daily.    Historical Provider, MD  cetirizine (ZYRTEC) 10 MG tablet Take 10 mg by mouth daily.    Historical Provider, MD  diphenhydrAMINE (BENADRYL) 25 MG tablet Take 25 mg by mouth every 6 (six) hours as needed.    Historical Provider, MD  docusate sodium (COLACE) 100 MG capsule Take 1 capsule (100 mg total) by mouth 2 (two) times daily. 11/07/14   Lanney Gins, PA-C  ferrous sulfate 325 (65 FE) MG tablet Take 1 tablet (325 mg total) by mouth 3 (three) times daily after meals. 11/07/14   Lanney Gins, PA-C  fluticasone (FLONASE) 50 MCG/ACT nasal spray Place 1 spray into  both nostrils daily.    Historical Provider, MD  HYDROcodone-acetaminophen (NORCO) 7.5-325 MG per tablet Take 1-2 tablets by mouth every 4 (four) hours as needed for moderate pain. 11/07/14   Lanney Gins, PA-C  loratadine (CLARITIN) 10 MG tablet Take 10 mg by mouth daily.    Historical Provider, MD  methocarbamol (ROBAXIN) 500 MG tablet Take 1 tablet (500 mg total) by mouth every 6 (six) hours as needed for muscle spasms. 11/07/14   Lanney Gins, PA-C  norethindrone-ethinyl estradiol-iron (MICROGESTIN FE,GILDESS FE,LOESTRIN FE) 1.5-30 MG-MCG tablet Take 1 tablet by mouth as directed. Takes everyday for 3 weeks then 1 week off    Historical Provider, MD  omeprazole (PRILOSEC) 20 MG capsule Take 1 capsule (20 mg total) by mouth daily. 03/11/16   Patsy Varma, PA-C  polyethylene glycol (MIRALAX / GLYCOLAX) packet Take 17 g by mouth 2 (two) times daily. 11/07/14   Lanney Gins, PA-C  predniSONE (DELTASONE) 20 MG tablet Take 2 tablets (40 mg total) by mouth daily. 03/11/16   Dahlia Client Matheu Ploeger, PA-C    Family History History reviewed. No pertinent family history.  Social History Social History  Substance Use Topics  . Smoking status: Never Smoker  . Smokeless tobacco: Never Used  . Alcohol use Yes     Comment: wine and liquor 8-14 glasses per week      Allergies   Corn-containing products; Gluten meal; Lactose intolerance (gi); and Sulfa antibiotics   Review of Systems Review of Systems  Constitutional: Negative for chills and fever.  HENT: Positive for sore throat.   Respiratory: Positive for cough and wheezing.   Gastrointestinal: Positive for nausea and vomiting. Negative for abdominal pain.  All other systems reviewed and are negative.  Physical Exam Updated Vital Signs BP (!) 180/120   Pulse (!) 129   Temp 98.5 F (36.9 C) (Oral)   Resp 22   LMP 03/04/2016   SpO2 97%   Physical Exam  Constitutional: She appears well-developed and well-nourished. No distress.    Awake, alert, nontoxic appearance  HENT:  Head: Normocephalic and atraumatic.  Right Ear: Tympanic membrane, external ear and ear canal normal.  Left Ear: Tympanic membrane, external ear and ear canal normal.  Nose: Nose normal. No mucosal edema or rhinorrhea.  Mouth/Throat: Uvula is midline and mucous membranes are normal. Mucous membranes are not dry. No trismus in the jaw. No uvula swelling. Posterior oropharyngeal erythema present. No oropharyngeal exudate, posterior oropharyngeal edema or tonsillar abscesses.  Eyes: Conjunctivae are normal. No scleral icterus.  Neck: Normal range of motion, full passive range of motion without pain and phonation normal. Neck supple. No tracheal tenderness, no spinous process tenderness and no muscular tenderness present. No neck rigidity. No erythema and normal range of motion  present. No Brudzinski's sign and no Kernig's sign noted.  Range of motion without pain  No midline or paraspinal tenderness Normal phonation No stridor Handling secretions without difficulty No nuchal rigidity or meningeal signs  Cardiovascular: Regular rhythm, normal heart sounds and intact distal pulses.  Tachycardia present.   Pulses:      Radial pulses are 2+ on the right side, and 2+ on the left side.  Pulmonary/Chest: Effort normal. No stridor. Tachypnea noted. No respiratory distress. She has decreased breath sounds ( Mild, throughout). She has no wheezes.  Equal chest expansion No focal wheezes or rhonchi  Abdominal: Soft. Bowel sounds are normal. She exhibits no mass. There is no tenderness. There is no rebound and no guarding.  Musculoskeletal: Normal range of motion. She exhibits no edema.  Lymphadenopathy:       Head (right side): No submental, no submandibular, no tonsillar, no preauricular, no posterior auricular and no occipital adenopathy present.       Head (left side): No submental, no submandibular, no tonsillar, no preauricular, no posterior auricular and no  occipital adenopathy present.    She has no cervical adenopathy.       Right cervical: No superficial cervical, no deep cervical and no posterior cervical adenopathy present.      Left cervical: No superficial cervical, no deep cervical and no posterior cervical adenopathy present.  Neurological: She is alert.  Speech is clear and goal oriented Moves extremities without ataxia  Skin: Skin is warm and dry. She is not diaphoretic.  Psychiatric: She has a normal mood and affect.  Nursing note and vitals reviewed.  ED Treatments / Results  DIAGNOSTIC STUDIES: Oxygen Saturation is 97% on RA, normal by my interpretation.    COORDINATION OF CARE: 12:23 AM-Discussed treatment plan which includes chest x-ray and breathing treatments with pt at bedside and pt agreed to plan.    Radiology Dg Chest 2 View  Result Date: 03/11/2016 CLINICAL DATA:  Acute onset of wheezing and shortness of breath. Initial encounter. EXAM: CHEST  2 VIEW COMPARISON:  Chest radiograph performed 11/02/2014 FINDINGS: The lungs are well-aerated and clear. There is no evidence of focal opacification, pleural effusion or pneumothorax. The heart is normal in size; the mediastinal contour is within normal limits. No acute osseous abnormalities are seen. IMPRESSION: No acute cardiopulmonary process seen. Electronically Signed   By: Roanna Raider M.D.   On: 03/11/2016 01:37    Procedures Procedures (including critical care time)  Medications Ordered in ED Medications  albuterol (PROVENTIL) (2.5 MG/3ML) 0.083% nebulizer solution 5 mg (5 mg Nebulization Given 03/10/16 2353)  predniSONE (DELTASONE) tablet 60 mg (60 mg Oral Given 03/11/16 0034)  gi cocktail (Maalox,Lidocaine,Donnatal) (30 mLs Oral Given 03/11/16 0038)  ondansetron (ZOFRAN-ODT) disintegrating tablet 8 mg (8 mg Oral Given 03/11/16 0037)     Initial Impression / Assessment and Plan / ED Course  I have reviewed the triage vital signs and the nursing  notes.  Pertinent labs & imaging results that were available during my care of the patient were reviewed by me and considered in my medical decision making (see chart for details).  Clinical Course  Value Comment By Time  BP: (!) 180/120 Hypertensive and tachycardic at triage Dahlia Client Corlis Angelica, PA-C 10/10 0100   Patient reports improvement in her breathing but continues to have severe sore throat. Patient reports no improvement with GI cocktail. She denies posterior neck pain. Concern exists for possible retropharyngeal abscess. Discussed with patient and significant other at bedside. They  wish for discharge home at this time and no further testing.  Discussed reasons to return to the emergency department including fevers, worsening pain, feeling of throat swelling or difficulty breathing.  She has no stridor. Significant other reports normal phonation. No clinical evidence of peritonsillar abscess.  Venus Ruhe, PA-C 10/10 0225  DG Chest 2 View No evidence of pneumonia. Sol Englert, PA-C 10/10 [phone]    Patient ambulated in ED with O2 saturations maintained >90, no current signs of respiratory distress. Lung exam improved after nebulizer treatment. Prednisone given in the ED and pt will be dc with 5 day burst. Pt states they are breathing at baseline. Pt has been instructed to continue using prescribed medications and to speak with PCP about today's exacerbation.  Patient will be discharged with omeprazole for acid reflux.    Final Clinical Impressions(s) / ED Diagnoses   Final diagnoses:  Mild asthma with exacerbation, unspecified whether persistent  Sore throat  Post-tussive emesis     New Prescriptions Discharge Medication List as of 03/11/2016  2:33 AM    START taking these medications   Details  omeprazole (PRILOSEC) 20 MG capsule Take 1 capsule (20 mg total) by mouth daily., Starting Tue 03/11/2016, Print    predniSONE (DELTASONE) 20 MG tablet Take 2 tablets (40  mg total) by mouth daily., Starting Tue 03/11/2016, [employer] Tyheem Boughner, PA-C 03/11/16 2536    Dione Booze, MD 03/11/16 [phone]

## 2016-03-11 NOTE — ED Notes (Signed)
Bed: WA06 Expected date:  Expected time:  Means of arrival:  Comments: Tri 1 

## 2016-03-11 NOTE — ED Notes (Signed)
Pt still complains of severe throat pain upon swallowing.

## 2016-03-11 NOTE — ED Notes (Signed)
PA at bedside.

## 2016-03-11 NOTE — ED Notes (Signed)
Pt states since she has been vomiting she now feels like she has something stuck in her throat and it hurts to swallow.   Trachea midline on assessment.

## 2016-03-11 NOTE — ED Notes (Signed)
Pt ambulatory and independent at discharge.  Verbalized understanding of discharge instructions 

## 2016-03-11 NOTE — Discharge Instructions (Signed)
1. Medications: albuterol, prednisone, omeprazole, usual home medications 2. Treatment: rest, drink plenty of fluids, begin OTC antihistamine (Zyrtec or Claritin)  3. Follow Up: Please followup with your primary doctor in 2-3 days for discussion of your diagnoses and further evaluation after today's visit; if you do not have a primary care doctor use the resource guide provided to find one; Please return to the ER for difficulty breathing, high fevers or worsening symptoms.

## 2016-05-21 ENCOUNTER — Ambulatory Visit: Payer: Managed Care, Other (non HMO) | Attending: Rheumatology

## 2016-05-21 DIAGNOSIS — L4059 Other psoriatic arthropathy: Secondary | ICD-10-CM | POA: Diagnosis present

## 2016-05-21 NOTE — Therapy (Signed)
[place], [place], [address] Phone: [phone]   Fax:  [phone]  Physical Therapy Evaluation/FCE/  Patient Details  Name: Ruth Charles MRN: [id number] Date of Birth: November 14, 1970 Referring Provider: Lahoma Rocker. MD  Encounter Date: 05/21/2016      PT End of Session - 05/21/16 1118    Visit Number 1   Number of Visits 1   PT Start Time 0720   PT Stop Time 1055   PT Time Calculation (min) 215 min   Activity Tolerance Patient limited by pain   Behavior During Therapy [facility] for tasks assessed/performed      Past Medical History:  Diagnosis Date  . Arthritis   . Asthma   . Bronchitis    hx of   . Complication of anesthesia   . Cough   . Diarrhea   . Fatigue   . Frequent urination at night   . GERD (gastroesophageal reflux disease)   . Gluten intolerance   . Lactose intolerance   . Mumps    hx of   . Night sweats   . PONV (postoperative nausea and vomiting)   . Seasonal allergies   . Shortness of breath dyspnea   . Streptococcal infection    hx of   . Tinnitus   . Wheezing     Past Surgical History:  Procedure Laterality Date  . APPENDECTOMY     2004  . BREAST SURGERY     breast biopsy left   . PATELLA-FEMORAL ARTHROPLASTY Left 11/06/2014   Procedure: LEFT KNEE PATELLA-FEMORAL ARTHROPLASTY;  Surgeon: Paralee Cancel, MD;  Location: [facility] ORS;  Service: Orthopedics;  Laterality: Left;  . removal right ovary with cyst     2004  . right TMJ     2002    There were no vitals filed for this visit.       Subjective Assessment - 05/21/16 1115    Subjective See FCE report in [facility]            [facility] PT Assessment - 05/21/16 0001      Assessment   Medical Diagnosis Polyarticular Psoriatic Arthritis   Referring Provider Lahoma Rocker. MD   Onset Date/Surgical Date --  2007     Precautions   Precautions None     Restrictions   Weight Bearing Restrictions No                            PT Education - 05/21/16 1116    Education provided Yes   Education Details Breifly reviewed the results of FCE and would send her the Basic summary sheets   Person(s) Educated Patient   Methods Explanation   Comprehension Verbalized understanding                    Plan - 05/21/16 1118    Clinical Impression Statement Ruth Charles completed the FCE process with work rating of MEDIUM level of work over 8 hour day.   See complete report scanned into EPIC   PT Next Visit Plan No PT follow up.   Report will be faxed and mailed to Dr Hope Pigeon and Agree with Plan of Care Patient      Patient will benefit from skilled therapeutic intervention in order to improve the following deficits and impairments:  Pain, Decreased activity tolerance, Decreased strength  Visit Diagnosis: Polyarticular psoriatic arthritis ([place])  Problem List Patient Active Problem List   Diagnosis Date Noted  . S/P left knee PF replacement 11/06/2014  . Status post unilateral knee replacement 11/06/2014    Darrel Hoover  PT 05/21/2016, 11:22 AM  [facility] [address] [place], [place], [address] Phone: [phone]   Fax:  [phone]  Name: Ruth Charles MRN: [id number] Date of Birth: 1970-09-15  PHYSICAL THERAPY DISCHARGE SUMMARY  Visits from Start of Care:    FCE ONLY  Current functional level related to goals / functional outcomes: SEE REPORT IN EPIC   Remaining deficits: SEE FCE  REPORT   Education / Equipment: Results of testing  Plan: Patient agrees to discharge.  Patient goals were not met. Patient is being discharged due to                                                     ?????   Completion of FCE

## 2017-04-09 ENCOUNTER — Other Ambulatory Visit: Payer: Self-pay | Admitting: Physician Assistant

## 2017-04-09 DIAGNOSIS — R600 Localized edema: Secondary | ICD-10-CM

## 2017-04-13 ENCOUNTER — Encounter (HOSPITAL_COMMUNITY): Payer: Self-pay | Admitting: Emergency Medicine

## 2017-04-13 ENCOUNTER — Emergency Department (HOSPITAL_COMMUNITY)
Admission: EM | Admit: 2017-04-13 | Discharge: 2017-04-13 | Disposition: A | Discharge status: Home / Self Care | Payer: Managed Care, Other (non HMO) | Attending: Emergency Medicine | Admitting: Emergency Medicine

## 2017-04-13 ENCOUNTER — Other Ambulatory Visit: Payer: Self-pay

## 2017-04-13 ENCOUNTER — Ambulatory Visit
Admission: RE | Admit: 2017-04-13 | Discharge: 2017-04-13 | Disposition: A | Discharge status: Home / Self Care | Payer: Managed Care, Other (non HMO) | Source: Ambulatory Visit | Attending: Physician Assistant | Admitting: Physician Assistant

## 2017-04-13 DIAGNOSIS — I82431 Acute embolism and thrombosis of right popliteal vein: Secondary | ICD-10-CM | POA: Insufficient documentation

## 2017-04-13 DIAGNOSIS — M79604 Pain in right leg: Secondary | ICD-10-CM | POA: Diagnosis present

## 2017-04-13 DIAGNOSIS — Z91040 Latex allergy status: Secondary | ICD-10-CM | POA: Insufficient documentation

## 2017-04-13 DIAGNOSIS — J45909 Unspecified asthma, uncomplicated: Secondary | ICD-10-CM | POA: Insufficient documentation

## 2017-04-13 DIAGNOSIS — Z79899 Other long term (current) drug therapy: Secondary | ICD-10-CM | POA: Insufficient documentation

## 2017-04-13 DIAGNOSIS — I824Y1 Acute embolism and thrombosis of unspecified deep veins of right proximal lower extremity: Secondary | ICD-10-CM | POA: Diagnosis not present

## 2017-04-13 DIAGNOSIS — R600 Localized edema: Secondary | ICD-10-CM

## 2017-04-13 LAB — CBC WITH DIFFERENTIAL/PLATELET
BASOS ABS: 0.1 10*3/uL (ref 0.0–0.1)
Basophils Relative: 1 %
Eosinophils Absolute: 0 10*3/uL (ref 0.0–0.7)
Eosinophils Relative: 0 %
HEMATOCRIT: 34 % — AB (ref 36.0–46.0)
Hemoglobin: 11.6 g/dL — ABNORMAL LOW (ref 12.0–15.0)
Lymphocytes Relative: 22 %
Lymphs Abs: 1.5 10*3/uL (ref 0.7–4.0)
MCH: 38.3 pg — ABNORMAL HIGH (ref 26.0–34.0)
MCHC: 34.1 g/dL (ref 30.0–36.0)
MCV: 112.2 fL — ABNORMAL HIGH (ref 78.0–100.0)
MONO ABS: 0.4 10*3/uL (ref 0.1–1.0)
Monocytes Relative: 6 %
Neutro Abs: 4.6 10*3/uL (ref 1.7–7.7)
Neutrophils Relative %: 71 %
PLATELETS: 363 10*3/uL (ref 150–400)
RBC: 3.03 MIL/uL — AB (ref 3.87–5.11)
RDW: 15.7 % — AB (ref 11.5–15.5)
WBC: 6.6 10*3/uL (ref 4.0–10.5)

## 2017-04-13 LAB — BASIC METABOLIC PANEL
ANION GAP: 9 (ref 5–15)
BUN: 8 mg/dL (ref 6–20)
CALCIUM: 9.1 mg/dL (ref 8.9–10.3)
CO2: 20 mmol/L — ABNORMAL LOW (ref 22–32)
Chloride: 108 mmol/L (ref 101–111)
Creatinine, Ser: 0.73 mg/dL (ref 0.44–1.00)
GLUCOSE: 79 mg/dL (ref 65–99)
POTASSIUM: 3.7 mmol/L (ref 3.5–5.1)
Sodium: 137 mmol/L (ref 135–145)

## 2017-04-13 LAB — ABO/RH: ABO/RH(D): B NEG

## 2017-04-13 LAB — TYPE AND SCREEN
ABO/RH(D): B NEG
ANTIBODY SCREEN: NEGATIVE

## 2017-04-13 MED ORDER — RIVAROXABAN 15 MG PO TABS
15.0000 mg | ORAL_TABLET | Freq: Two times a day (BID) | ORAL | Status: DC
  Administered 2017-04-13: 15 mg via ORAL
  Filled 2017-04-13 (×2): qty 1

## 2017-04-13 MED ORDER — RIVAROXABAN 20 MG PO TABS: 20.0000 mg | ORAL_TABLET | Freq: Every day | ORAL | Status: DC

## 2017-04-13 MED ORDER — RIVAROXABAN (XARELTO) VTE STARTER PACK (15 & 20 MG): 15.0000 mg | ORAL_TABLET | Freq: Two times a day (BID) | ORAL | 0 refills | Status: DC

## 2017-04-13 NOTE — Consult Note (Signed)
Hospital Consult    Reason for Consult:  dvt Referring Physician:  consult MRN #:  [id number]  History of Present Illness: This is a 46 y.o. female without history of dvt presented for lower extremity dvt evaluation at the direction of her rheumatologist. This study was positive. She actually has a few weeks of swelling below the knee bilaterally. Does not smoke. Takes birth control but unsure of estrogren combination. No family history of dvt. Has baseline pain in ble from arthritis but no new pain recently. Upper legs are not involved. Has not attempted any alleviating remedies. Never taken blood thinners.  Past Medical History:  Diagnosis Date  . Arthritis   . Asthma   . Bronchitis    hx of   . Complication of anesthesia   . Cough   . Diarrhea   . Fatigue   . Frequent urination at night   . GERD (gastroesophageal reflux disease)   . Gluten intolerance   . Lactose intolerance   . Mumps    hx of   . Night sweats   . PONV (postoperative nausea and vomiting)   . Seasonal allergies   . Shortness of breath dyspnea   . Streptococcal infection    hx of   . Tinnitus   . Wheezing     Past Surgical History:  Procedure Laterality Date  . APPENDECTOMY     2004  . BREAST SURGERY     breast biopsy left   . removal right ovary with cyst     2004  . right TMJ     2002    Allergies  Allergen Reactions  . Corn-Containing Products Other (See Comments)    Chest congestion  . Gluten Meal Other (See Comments)    Nausea and diarrhea  . Lactose Intolerance (Gi) Other (See Comments)    Chest congestion  . Remicade [Infliximab] Shortness Of Breath and Other (See Comments)    "Lightheadedness" also  . Cosentyx [Secukinumab] Other (See Comments)    Worsened symptoms  . Enbrel [Etanercept] Other (See Comments)    Psoriasis worsened  . Humira [Adalimumab] Other (See Comments)    Psoriasis worsened  . Sulfa Antibiotics Hives  . Tape Other (See Comments)    TEARS THE SKIN!! COBAN  WRAP IS TOLERATED  . Latex Rash    Prior to Admission medications   Medication Sig Start Date End Date Taking? Authorizing Provider  albuterol (PROVENTIL HFA;VENTOLIN HFA) 108 (90 BASE) MCG/ACT inhaler Inhale 2 puffs into the lungs every 6 (six) hours as needed for wheezing or shortness of breath.    Yes [provider]  ALPRAZolam (XANAX) 0.25 MG tablet Take 0.25 mg by mouth 3 (three) times daily as needed for anxiety.   Yes [provider]  budesonide-formoterol (SYMBICORT) 160-4.5 MCG/ACT inhaler Inhale 2 puffs into the lungs 2 (two) times daily.   Yes [provider]  cetirizine (ZYRTEC) 10 MG tablet Take 10 mg by mouth daily.   Yes [provider]  diphenhydrAMINE (BENADRYL) 25 MG tablet Take 25 mg every 6 (six) hours as needed by mouth (for "back up" if Zyrtec is not effective).    Yes [provider]  fluticasone (FLONASE) 50 MCG/ACT nasal spray Place 1 spray into both nostrils daily.   Yes [provider]  furosemide (LASIX) 40 MG tablet Take 40 mg daily as needed by mouth (for obvious swelling of the ankles).   Yes [provider]  norethindrone-ethinyl estradiol-iron (JUNEL FE 1.5/30) 1.5-30 MG-MCG  tablet Take 1 tablet See admin instructions by mouth. 1 tablet once a day for three consecutive weeks then OFF for one week   Yes [provider]  omeprazole (PRILOSEC) 20 MG capsule Take 1 capsule (20 mg total) by mouth daily. 03/11/16  Yes Muthersbaugh, Dahlia Client, PA-C  traMADol (ULTRAM) 50 MG tablet Take 50 mg every 8 (eight) hours as needed by mouth (for pain).    Yes [provider]  docusate sodium (COLACE) 100 MG capsule Take 1 capsule (100 mg total) by mouth 2 (two) times daily. Patient not taking: Reported on 04/13/2017 11/07/14   Lanney Gins, PA-C  ferrous sulfate 325 (65 FE) MG tablet Take 1 tablet (325 mg total) by mouth 3 (three) times daily after meals. Patient not taking: Reported on 04/13/2017  11/07/14   Lanney Gins, PA-C  HYDROcodone-acetaminophen (NORCO) 7.5-325 MG per tablet Take 1-2 tablets by mouth every 4 (four) hours as needed for moderate pain. Patient not taking: Reported on 04/13/2017 11/07/14   Lanney Gins, PA-C  methocarbamol (ROBAXIN) 500 MG tablet Take 1 tablet (500 mg total) by mouth every 6 (six) hours as needed for muscle spasms. Patient not taking: Reported on 04/13/2017 11/07/14   Lanney Gins, PA-C  polyethylene glycol (MIRALAX / Ethelene Hal) packet Take 17 g by mouth 2 (two) times daily. Patient not taking: Reported on 04/13/2017 11/07/14   Lanney Gins, PA-C  predniSONE (DELTASONE) 20 MG tablet Take 2 tablets (40 mg total) by mouth daily. Patient not taking: Reported on 04/13/2017 03/11/16   Muthersbaugh, Dahlia Client, PA-C  Tofacitinib Citrate (XELJANZ) 5 MG TABS Take 5 mg 2 (two) times daily by mouth.    [provider]    Social History   Socioeconomic History  . Marital status: Married    Spouse name: Not on file  . Number of children: Not on file  . Years of education: Not on file  . Highest education level: Not on file  Social Needs  . Financial resource strain: Not on file  . Food insecurity - worry: Not on file  . Food insecurity - inability: Not on file  . Transportation needs - medical: Not on file  . Transportation needs - non-medical: Not on file  Occupational History  . Not on file  Tobacco Use  . Smoking status: Never Smoker  . Smokeless tobacco: Never Used  Substance and Sexual Activity  . Alcohol use: Yes    Comment: wine and liquor 8-14 glasses per week   . Drug use: No  . Sexual activity: Not on file  Other Topics Concern  . Not on file  Social History Narrative  . Not on file     History reviewed. No pertinent family history.  ROS: [x]  Positive   [ ]  Negative   [ ]  All sytems reviewed and are negative  Cardiovascular: []  chest pain/pressure []  palpitations []  SOB lying flat []  DOE []  pain in legs while  walking []  pain in legs at rest []  pain in legs at night []  non-healing ulcers []  hx of DVT [x]  swelling in legs  Pulmonary: []  productive cough []  asthma/wheezing []  home O2  Neurologic: []  weakness in []  arms []  legs []  numbness in []  arms []  legs []  hx of CVA []  mini stroke [] difficulty speaking or slurred speech []  temporary loss of vision in one eye []  dizziness  Hematologic: []  hx of cancer []  bleeding problems []  problems with blood clotting easily  Endocrine:   []  diabetes []  thyroid disease  GI []  vomiting blood []  blood in stool  GU: []  CKD/renal failure []  HD--[]  M/W/F or []  T/T/S []  burning with urination []  blood in urine  Psychiatric: []  anxiety []  depression  Musculoskeletal: [x]  arthritis [x]  joint pain  Integumentary: []  rashes []  ulcers  Constitutional: []  fever []  chills   Physical Examination  Vitals:   04/13/17 1554  BP: (!) 173/89  Pulse: 78  Resp: 15  Temp: 98.4 F (36.9 C)  SpO2: 100%   Body mass index is 26.95 kg/m.  General:  WDWN in NAD HENT: WNL, normocephalic Pulmonary: normal non-labored breathing Cardiac: palpable radial and pedal pulses Abdomen: soft, NT/ND, no masses Musculoskeletal: ble pitting edema Neurologic: A&O X 3 ; SENSATION: normal; MOTOR FUNCTION:  moving all extremities equally. Speech is fluent/normal Psychiatric:  Appropriate Affect  CBC    Component Value Date/Time   WBC 13.8 (H) 11/07/2014 0430   RBC 3.19 (L) 11/07/2014 0430   HGB 11.4 (L) 11/07/2014 0430   HCT 34.5 (L) 11/07/2014 0430   PLT 242 11/07/2014 0430   MCV 108.2 (H) 11/07/2014 0430   MCH 35.7 (H) 11/07/2014 0430   MCHC 33.0 11/07/2014 0430   RDW 13.8 11/07/2014 0430    BMET    Component Value Date/Time   NA 134 (L) 11/07/2014 0430   K 3.6 11/07/2014 0430   CL 106 11/07/2014 0430   CO2 21 (L) 11/07/2014 0430   GLUCOSE 126 (H) 11/07/2014 0430   BUN 9 11/07/2014 0430   CREATININE 0.55 11/07/2014 0430   CALCIUM 8.1  (L) 11/07/2014 0430   GFRNONAA >60 11/07/2014 0430   GFRAA >60 11/07/2014 0430    COAGS: Lab Results  Component Value Date   INR 1.03 11/02/2014     Non-Invasive Vascular Imaging:   IMPRESSION: There is extensive DVT throughout the right common femoral, femoral, and popliteal veins. It is partially occlusive in the common femoral vein but occlusive in the femoral and popliteal veins.   ASSESSMENT/PLAN: This is a 46 y.o. female with extensive rle dvt that is non-occlusive the common femoral. Interestingly this is a finding from a study ordered 2 weeks ago per patient and swelling has been present for a few weeks. As such, no vascular intervention is warranted. We discussed her risks of dvt including use of hormonal ocp's which might be her only risk factor. She needs at least 3 months of anticoagulation and is ok for compression therapy and ambulation once anticoagulated. She can f/u with me on a prn basis.   Sarenity Ramaker C. 01/07/2015, MD [facility] Office: [phone] Pager: [phone]

## 2017-04-13 NOTE — Progress Notes (Signed)
ANTICOAGULATION CONSULT NOTE - Initial Consult  Pharmacy Consult for Xarelto Indication: DVT  Allergies  Allergen Reactions  . Corn-Containing Products Other (See Comments)    Chest congestion  . Gluten Meal Other (See Comments)    Nausea and diarrhea  . Lactose Intolerance (Gi) Other (See Comments)    Chest congestion  . Remicade [Infliximab] Shortness Of Breath and Other (See Comments)    "Lightheadedness" also  . Cosentyx [Secukinumab] Other (See Comments)    Worsened symptoms  . Enbrel [Etanercept] Other (See Comments)    Psoriasis worsened  . Humira [Adalimumab] Other (See Comments)    Psoriasis worsened  . Sulfa Antibiotics Hives  . Tape Other (See Comments)    TEARS THE SKIN!! COBAN WRAP IS TOLERATED  . Latex Rash    Patient Measurements: Height: 5\' 4"  (162.6 cm) Weight: 157 lb (71.2 kg) IBW/kg (Calculated) : 54.7   Vital Signs: Temp: 98.4 F (36.9 C) (11/12 1554) Temp Source: Oral (11/12 1554) BP: 148/98 (11/12 1815) Pulse Rate: 86 (11/12 1815)  Labs: Recent Labs    04/13/17 1733  HGB 11.6*  HCT 34.0*  PLT 363  CREATININE 0.73    Estimated Creatinine Clearance: 83.2 mL/min (by C-G formula based on SCr of 0.73 mg/dL).   Medical History: Past Medical History:  Diagnosis Date  . Arthritis   . Asthma   . Bronchitis    hx of   . Complication of anesthesia   . Cough   . Diarrhea   . Fatigue   . Frequent urination at night   . GERD (gastroesophageal reflux disease)   . Gluten intolerance   . Lactose intolerance   . Mumps    hx of   . Night sweats   . PONV (postoperative nausea and vomiting)   . Seasonal allergies   . Shortness of breath dyspnea   . Streptococcal infection    hx of   . Tinnitus   . Wheezing     Assessment: 46Yo female with extensive RLE DVT that is non-occlusive the common femoral.   No active bleeding, no anticoagulation prior to admission, Scr 0.73, Hgb 11.6, platelets 363   Goal of Therapy:  Therapeutic  anticoagulation    Plan:  Xarelto 15mg  PO BID with food x21 days followed by 20mg  QD with food.   F/U any s/sx active bleeding, renal function   46Yo  PharmD Candidate '19 04/06/2017 3:30 PM

## 2017-04-13 NOTE — ED Provider Notes (Signed)
[facility] EMERGENCY DEPARTMENT Provider Note  CSN: [id number] Arrival date & time: 04/13/17  1531 History   Chief Complaint Chief Complaint  Patient presents with  . Leg Pain   HPI Ruth Charles is a 46 y.o. female with PMH of psoriatic arthritis who presented after she was found to have a large right lower extremity DVT.  Patient states that she had noted she had had increased RLE swelling that appeared worse than usual. She states she normally has bilaterally lower extremity swelling, but about 3 weeks ago the swelling appeared asymmetric with it being worse in the right leg. The patient states she saw her PCP who ordered the [place] that she had earlier today and was advised to come to the emergency department. She had not had any associated SOB or chest pain. Denies any recent fever or chills.   The history is provided by the patient.    Past Medical History:  Diagnosis Date  . Arthritis   . Asthma   . Bronchitis    hx of   . Complication of anesthesia   . Cough   . Diarrhea   . Fatigue   . Frequent urination at night   . GERD (gastroesophageal reflux disease)   . Gluten intolerance   . Lactose intolerance   . Mumps    hx of   . Night sweats   . PONV (postoperative nausea and vomiting)   . Seasonal allergies   . Shortness of breath dyspnea   . Streptococcal infection    hx of   . Tinnitus   . Wheezing    Patient Active Problem List   Diagnosis Date Noted  . S/P left knee PF replacement 11/06/2014  . Status post unilateral knee replacement 11/06/2014   Past Surgical History:  Procedure Laterality Date  . APPENDECTOMY     2004  . BREAST SURGERY     breast biopsy left   . removal right ovary with cyst     2004  . right TMJ     2002   OB History    No data available     Home Medications    Prior to Admission medications   Medication Sig Start Date End Date Taking? Authorizing Provider  albuterol (PROVENTIL HFA;VENTOLIN HFA) 108 (90  BASE) MCG/ACT inhaler Inhale 2 puffs into the lungs every 6 (six) hours as needed for wheezing or shortness of breath.    Yes [provider]  ALPRAZolam (XANAX) 0.25 MG tablet Take 0.25 mg by mouth 3 (three) times daily as needed for anxiety.   Yes [provider]  budesonide-formoterol (SYMBICORT) 160-4.5 MCG/ACT inhaler Inhale 2 puffs into the lungs 2 (two) times daily.   Yes [provider]  cetirizine (ZYRTEC) 10 MG tablet Take 10 mg by mouth daily.   Yes [provider]  diphenhydrAMINE (BENADRYL) 25 MG tablet Take 25 mg every 6 (six) hours as needed by mouth (for "back up" if Zyrtec is not effective).    Yes [provider]  fluticasone (FLONASE) 50 MCG/ACT nasal spray Place 1 spray into both nostrils daily.   Yes [provider]  furosemide (LASIX) 40 MG tablet Take 40 mg daily as needed by mouth (for obvious swelling of the ankles).   Yes [provider]  norethindrone-ethinyl estradiol-iron (JUNEL FE 1.5/30) 1.5-30 MG-MCG tablet Take 1 tablet See admin instructions by mouth. 1 tablet once a day for three consecutive weeks then OFF for one week  Yes [provider]  omeprazole (PRILOSEC) 20 MG capsule Take 1 capsule (20 mg total) by mouth daily. 03/11/16  Yes Muthersbaugh, Dahlia Client, PA-C  traMADol (ULTRAM) 50 MG tablet Take 50 mg every 8 (eight) hours as needed by mouth (for pain).    Yes [provider]  docusate sodium (COLACE) 100 MG capsule Take 1 capsule (100 mg total) by mouth 2 (two) times daily. Patient not taking: Reported on 04/13/2017 11/07/14   Lanney Gins, PA-C  ferrous sulfate 325 (65 FE) MG tablet Take 1 tablet (325 mg total) by mouth 3 (three) times daily after meals. Patient not taking: Reported on 04/13/2017 11/07/14   Lanney Gins, PA-C  HYDROcodone-acetaminophen (NORCO) 7.5-325 MG per tablet Take 1-2 tablets by mouth every 4 (four) hours as needed for moderate pain. Patient not taking:  Reported on 04/13/2017 11/07/14   Lanney Gins, PA-C  methocarbamol (ROBAXIN) 500 MG tablet Take 1 tablet (500 mg total) by mouth every 6 (six) hours as needed for muscle spasms. Patient not taking: Reported on 04/13/2017 11/07/14   Lanney Gins, PA-C  polyethylene glycol (MIRALAX / Ethelene Hal) packet Take 17 g by mouth 2 (two) times daily. Patient not taking: Reported on 04/13/2017 11/07/14   Lanney Gins, PA-C  predniSONE (DELTASONE) 20 MG tablet Take 2 tablets (40 mg total) by mouth daily. Patient not taking: Reported on 04/13/2017 03/11/16   Muthersbaugh, Dahlia Client, PA-C  Tofacitinib Citrate (XELJANZ) 5 MG TABS Take 5 mg 2 (two) times daily by mouth.    [provider]    Family History History reviewed. No pertinent family history.  Social History Social History   Tobacco Use  . Smoking status: Never Smoker  . Smokeless tobacco: Never Used  Substance Use Topics  . Alcohol use: Yes    Comment: wine and liquor 8-14 glasses per week   . Drug use: No   Allergies   Corn-containing products; Gluten meal; Lactose intolerance (gi); Remicade [infliximab]; Cosentyx [secukinumab]; Enbrel [etanercept]; Humira [adalimumab]; Sulfa antibiotics; Tape; and Latex  Review of Systems Review of Systems  Constitutional: Negative for chills and fever.  HENT: Negative for ear pain and sore throat.   Eyes: Negative for pain and visual disturbance.  Respiratory: Negative for cough and shortness of breath.   Cardiovascular: Positive for leg swelling. Negative for chest pain and palpitations.  Gastrointestinal: Negative for abdominal pain and vomiting.  Genitourinary: Negative for dysuria and hematuria.  Musculoskeletal: Negative for arthralgias and back pain.  Skin: Negative for color change and rash.  Neurological: Negative for seizures and syncope.  All other systems reviewed and are negative.  Physical Exam Updated Vital Signs BP (!) 173/89 (BP Location: Right Arm)   Pulse 78   Temp  98.4 F (36.9 C) (Oral)   Resp 15   Ht 5\' 4"  (1.626 m)   Wt 71.2 kg (157 lb)   LMP 03/23/2017   SpO2 100%   BMI 26.95 kg/m   Physical Exam  Constitutional: She is oriented to person, place, and time. She appears well-developed and well-nourished. No distress.  HENT:  Head: Normocephalic and atraumatic.  Eyes: Conjunctivae are normal.  Neck: Neck supple.  Cardiovascular: Normal rate and regular rhythm.  No murmur heard. Pulmonary/Chest: Effort normal and breath sounds normal. No respiratory distress.  Abdominal: Soft. There is no tenderness.  Musculoskeletal: Normal range of motion.       Right lower leg: She exhibits swelling and edema.       Left lower leg: Normal.  Neurological: She  is alert and oriented to person, place, and time.  Skin: Skin is warm and dry.  Psychiatric: She has a normal mood and affect.  Nursing note and vitals reviewed.  ED Treatments / Results  Labs (all labs ordered are listed, but only abnormal results are displayed) Labs Reviewed  CBC WITH DIFFERENTIAL/PLATELET - Abnormal; Notable for the following components:      Result Value   RBC 3.03 (*)    Hemoglobin 11.6 (*)    HCT 34.0 (*)    MCV 112.2 (*)    MCH 38.3 (*)    RDW 15.7 (*)    All other components within normal limits  BASIC METABOLIC PANEL - Abnormal; Notable for the following components:   CO2 20 (*)    All other components within normal limits  TYPE AND SCREEN  ABO/RH   EKG  EKG Interpretation None      Radiology US Venous Img Lower Unilateral Right  Result Date: 04/13/2017 CLINICAL DATA:  Right lower extremity swelling and edema for 3 weeks. There has been on and off swelling for 3 years. EXAM: RIGHT LOWER EXTREMITY VENOUS DUPLEX ULTRASOUND TECHNIQUE: Doppler venous assessment of the right lower extremity deep venous system was performed, including characterization of spectral flow, compressibility, and phasicity. COMPARISON:  None. FINDINGS: The right common femoral vein  is partially compressible and contains partially occlusive DVT. Partially occlusive thrombus does extend into the greater saphenous vein at the saphenous femoral junction. The femoral and popliteal veins are noncompressible compatible with occlusive thrombus. The profunda femoral vein is also an noncompressible compatible with occlusive thrombus. There is occlusive thrombus in the posterior tibial veins. Subcutaneous edema in the calf is noted. The greater saphenous vein beyond the saphenous femoral junction is compressible. IMPRESSION: There is extensive DVT throughout the right common femoral, femoral, and popliteal veins. It is partially occlusive in the common femoral vein but occlusive in the femoral and popliteal veins. Electronically Signed   By: Jolaine Click M.D.   On: 04/13/2017 14:49   Procedures Procedures (including critical care time)  Medications Ordered in ED Medications - No data to display  Initial Impression / Assessment and Plan / ED Course  I have reviewed the triage vital signs and the nursing notes.  Pertinent labs & imaging results that were available during my care of the patient were reviewed by me and considered in my medical decision making (see chart for details).  DELISHA PEADEN is a 46 y.o. female who with PMH of psoriatic arthritis who presented with RLE DVT.   Discussed this patient's case with Dr. Randie Heinz, vascular surgery who saw and evaluated the patient in the ED.  At this time does not recommend vascular intervention.  Discussed with the patient her lab results. And starting Xarelto for the DVT. Patient given information on the medication, both verbally an written. Informed that while on xarelto she is at increased risk of bleeding and should seek medical care for any injury with difficult to control bleeding.   Pharmacy had discussion with the patient as well about medication administration and information about Xarelto.  Advised her to follow up with her PCP  in 1 week and discussion if she will need further evaluation by hematology. The patient agrees with the plan. Discharged in stable condition.  Final Clinical Impressions(s) / ED Diagnoses   Final diagnoses:  Acute deep vein thrombosis (DVT) of popliteal vein of right lower extremity [facility])    ED Discharge Orders  Ordered    Rivaroxaban 15 & 20 MG TBPK  2 times daily     04/13/17 1847       Lamont Snowball, MD 04/14/17 0940    Derwood Kaplan, MD 04/14/17 [phone]

## 2017-04-13 NOTE — Discharge Instructions (Signed)
Please follow up with you PCP for future refills of this medication. You will potentially need to follow up with hematology after further discussion with your PCP for further workup and discussion about how long you will require anticoagulation. Please do not discontinue use until cleared by you physician to do so.  Please be aware that Xarelto puts you at an increased risk for bleeding after minor injury and you make need to be re-evaluated by a physician if you sustain any minor injury that do not stop bleeding or head injuries.

## 2017-04-13 NOTE — ED Triage Notes (Signed)
Pt states she just came from [facility] imaging and was told she has a large blood clot in right leg. Pt has been having pain/swelling in right leg over the past 3 weeks. Denies SOB/CP

## 2017-04-20 ENCOUNTER — Emergency Department (HOSPITAL_COMMUNITY): Payer: Managed Care, Other (non HMO)

## 2017-04-20 ENCOUNTER — Encounter (HOSPITAL_COMMUNITY): Payer: Self-pay | Admitting: Emergency Medicine

## 2017-04-20 ENCOUNTER — Other Ambulatory Visit: Payer: Self-pay

## 2017-04-20 ENCOUNTER — Inpatient Hospital Stay (HOSPITAL_COMMUNITY)
Admission: EM | Admit: 2017-04-20 | Discharge: 2017-04-24 | DRG: 982 | Disposition: A | Discharge status: Home / Self Care | Payer: Managed Care, Other (non HMO) | Attending: Internal Medicine | Admitting: Internal Medicine

## 2017-04-20 DIAGNOSIS — Z7951 Long term (current) use of inhaled steroids: Secondary | ICD-10-CM | POA: Diagnosis not present

## 2017-04-20 DIAGNOSIS — M069 Rheumatoid arthritis, unspecified: Secondary | ICD-10-CM | POA: Diagnosis present

## 2017-04-20 DIAGNOSIS — I82411 Acute embolism and thrombosis of right femoral vein: Secondary | ICD-10-CM | POA: Diagnosis present

## 2017-04-20 DIAGNOSIS — Z888 Allergy status to other drugs, medicaments and biological substances status: Secondary | ICD-10-CM

## 2017-04-20 DIAGNOSIS — K922 Gastrointestinal hemorrhage, unspecified: Secondary | ICD-10-CM | POA: Diagnosis not present

## 2017-04-20 DIAGNOSIS — D539 Nutritional anemia, unspecified: Secondary | ICD-10-CM | POA: Diagnosis present

## 2017-04-20 DIAGNOSIS — Z882 Allergy status to sulfonamides status: Secondary | ICD-10-CM

## 2017-04-20 DIAGNOSIS — K219 Gastro-esophageal reflux disease without esophagitis: Secondary | ICD-10-CM | POA: Diagnosis present

## 2017-04-20 DIAGNOSIS — J45909 Unspecified asthma, uncomplicated: Secondary | ICD-10-CM | POA: Diagnosis present

## 2017-04-20 DIAGNOSIS — Z79890 Hormone replacement therapy (postmenopausal): Secondary | ICD-10-CM | POA: Diagnosis not present

## 2017-04-20 DIAGNOSIS — T783XXA Angioneurotic edema, initial encounter: Secondary | ICD-10-CM

## 2017-04-20 DIAGNOSIS — I825Z9 Chronic embolism and thrombosis of unspecified deep veins of unspecified distal lower extremity: Secondary | ICD-10-CM | POA: Diagnosis not present

## 2017-04-20 DIAGNOSIS — Z91040 Latex allergy status: Secondary | ICD-10-CM | POA: Diagnosis not present

## 2017-04-20 DIAGNOSIS — J384 Edema of larynx: Secondary | ICD-10-CM | POA: Diagnosis present

## 2017-04-20 DIAGNOSIS — Z23 Encounter for immunization: Secondary | ICD-10-CM | POA: Diagnosis not present

## 2017-04-20 DIAGNOSIS — J9601 Acute respiratory failure with hypoxia: Secondary | ICD-10-CM

## 2017-04-20 DIAGNOSIS — I10 Essential (primary) hypertension: Secondary | ICD-10-CM | POA: Diagnosis present

## 2017-04-20 DIAGNOSIS — L4050 Arthropathic psoriasis, unspecified: Secondary | ICD-10-CM | POA: Diagnosis present

## 2017-04-20 DIAGNOSIS — E739 Lactose intolerance, unspecified: Secondary | ICD-10-CM | POA: Diagnosis present

## 2017-04-20 DIAGNOSIS — K2210 Ulcer of esophagus without bleeding: Secondary | ICD-10-CM | POA: Diagnosis present

## 2017-04-20 DIAGNOSIS — R0602 Shortness of breath: Secondary | ICD-10-CM | POA: Diagnosis present

## 2017-04-20 DIAGNOSIS — I82431 Acute embolism and thrombosis of right popliteal vein: Secondary | ICD-10-CM | POA: Diagnosis present

## 2017-04-20 DIAGNOSIS — Z91048 Other nonmedicinal substance allergy status: Secondary | ICD-10-CM | POA: Diagnosis not present

## 2017-04-20 DIAGNOSIS — E876 Hypokalemia: Secondary | ICD-10-CM | POA: Diagnosis not present

## 2017-04-20 DIAGNOSIS — K449 Diaphragmatic hernia without obstruction or gangrene: Secondary | ICD-10-CM | POA: Diagnosis present

## 2017-04-20 DIAGNOSIS — J988 Other specified respiratory disorders: Secondary | ICD-10-CM | POA: Diagnosis present

## 2017-04-20 DIAGNOSIS — I82409 Acute embolism and thrombosis of unspecified deep veins of unspecified lower extremity: Secondary | ICD-10-CM

## 2017-04-20 DIAGNOSIS — K920 Hematemesis: Secondary | ICD-10-CM | POA: Diagnosis present

## 2017-04-20 LAB — CBC WITH DIFFERENTIAL/PLATELET
BASOS ABS: 0 10*3/uL (ref 0.0–0.1)
BASOS PCT: 0 %
Basophils Absolute: 0 10*3/uL (ref 0.0–0.1)
Basophils Relative: 0 %
EOS ABS: 0 10*3/uL (ref 0.0–0.7)
EOS ABS: 0 10*3/uL (ref 0.0–0.7)
EOS PCT: 0 %
Eosinophils Relative: 0 %
HCT: 32.5 % — ABNORMAL LOW (ref 36.0–46.0)
HEMATOCRIT: 33.5 % — AB (ref 36.0–46.0)
HEMOGLOBIN: 11.2 g/dL — AB (ref 12.0–15.0)
Hemoglobin: 10.8 g/dL — ABNORMAL LOW (ref 12.0–15.0)
LYMPHS ABS: 0.1 10*3/uL — AB (ref 0.7–4.0)
LYMPHS PCT: 1 %
Lymphocytes Relative: 1 %
Lymphs Abs: 0.1 10*3/uL — ABNORMAL LOW (ref 0.7–4.0)
MCH: 38.5 pg — AB (ref 26.0–34.0)
MCH: 38.4 pg — AB (ref 26.0–34.0)
MCHC: 33.4 g/dL (ref 30.0–36.0)
MCHC: 33.2 g/dL (ref 30.0–36.0)
MCV: 115.1 fL — ABNORMAL HIGH (ref 78.0–100.0)
MCV: 115.7 fL — AB (ref 78.0–100.0)
MONO ABS: 0.1 10*3/uL (ref 0.1–1.0)
Monocytes Absolute: 0.1 10*3/uL (ref 0.1–1.0)
Monocytes Relative: 1 %
Monocytes Relative: 1 %
NEUTROS PCT: 98 %
NEUTROS PCT: 98 %
Neutro Abs: 13.1 10*3/uL — ABNORMAL HIGH (ref 1.7–7.7)
Neutro Abs: 10.3 10*3/uL — ABNORMAL HIGH (ref 1.7–7.7)
PLATELETS: 301 10*3/uL (ref 150–400)
Platelets: 314 10*3/uL (ref 150–400)
RBC: 2.91 MIL/uL — ABNORMAL LOW (ref 3.87–5.11)
RBC: 2.81 MIL/uL — ABNORMAL LOW (ref 3.87–5.11)
RDW: 16.6 % — AB (ref 11.5–15.5)
RDW: 16.6 % — AB (ref 11.5–15.5)
WBC: 13.3 10*3/uL — ABNORMAL HIGH (ref 4.0–10.5)
WBC: 10.5 10*3/uL (ref 4.0–10.5)

## 2017-04-20 LAB — COMPREHENSIVE METABOLIC PANEL
ALT: 29 U/L (ref 14–54)
ANION GAP: 14 (ref 5–15)
AST: 83 U/L — AB (ref 15–41)
Albumin: 3.7 g/dL (ref 3.5–5.0)
Alkaline Phosphatase: 66 U/L (ref 38–126)
BUN: 6 mg/dL (ref 6–20)
CALCIUM: 8.7 mg/dL — AB (ref 8.9–10.3)
CHLORIDE: 103 mmol/L (ref 101–111)
CO2: 21 mmol/L — ABNORMAL LOW (ref 22–32)
Creatinine, Ser: 0.83 mg/dL (ref 0.44–1.00)
GFR calc Af Amer: >60 mL/min (ref >60)
Glucose, Bld: 101 mg/dL — ABNORMAL HIGH (ref 65–99)
POTASSIUM: 3.3 mmol/L — AB (ref 3.5–5.1)
Sodium: 138 mmol/L (ref 135–145)
TOTAL PROTEIN: 7.2 g/dL (ref 6.5–8.1)
Total Bilirubin: 1.8 mg/dL — ABNORMAL HIGH (ref 0.3–1.2)

## 2017-04-20 LAB — GLUCOSE, CAPILLARY
GLUCOSE-CAPILLARY: 171 mg/dL — AB (ref 65–99)
Glucose-Capillary: 162 mg/dL — ABNORMAL HIGH (ref 65–99)

## 2017-04-20 LAB — CBC
HCT: 37.4 % (ref 36.0–46.0)
HEMOGLOBIN: 13.1 g/dL (ref 12.0–15.0)
MCH: 39.7 pg — ABNORMAL HIGH (ref 26.0–34.0)
MCHC: 35 g/dL (ref 30.0–36.0)
MCV: 113.3 fL — ABNORMAL HIGH (ref 78.0–100.0)
Platelets: 368 10*3/uL (ref 150–400)
RBC: 3.3 MIL/uL — ABNORMAL LOW (ref 3.87–5.11)
RDW: 15.8 % — ABNORMAL HIGH (ref 11.5–15.5)
WBC: 14.6 10*3/uL — AB (ref 4.0–10.5)

## 2017-04-20 LAB — TYPE AND SCREEN
ABO/RH(D): B NEG
Antibody Screen: NEGATIVE

## 2017-04-20 LAB — MRSA PCR SCREENING: MRSA by PCR: NEGATIVE

## 2017-04-20 LAB — PROTIME-INR
INR: 1.69
Prothrombin Time: 19.7 seconds — ABNORMAL HIGH (ref 11.4–15.2)

## 2017-04-20 LAB — I-STAT BETA HCG BLOOD, ED (MC, WL, AP ONLY)

## 2017-04-20 LAB — POC OCCULT BLOOD, ED: FECAL OCCULT BLD: NEGATIVE

## 2017-04-20 LAB — LIPASE, BLOOD: Lipase: 65 U/L — ABNORMAL HIGH (ref 11–51)

## 2017-04-20 LAB — APTT: aPTT: 33 seconds (ref 24–36)

## 2017-04-20 LAB — TSH: TSH: 1.797 u[IU]/mL (ref 0.350–4.500)

## 2017-04-20 MED ORDER — ALBUTEROL SULFATE (2.5 MG/3ML) 0.083% IN NEBU
5.0000 mg | INHALATION_SOLUTION | Freq: Once | RESPIRATORY_TRACT | Status: AC
  Administered 2017-04-20: 5 mg via RESPIRATORY_TRACT
  Filled 2017-04-20: qty 6

## 2017-04-20 MED ORDER — PANTOPRAZOLE SODIUM 40 MG IV SOLR
40.0000 mg | Freq: Two times a day (BID) | INTRAVENOUS | Status: DC
  Administered 2017-04-20 – 2017-04-22 (×5): 40 mg via INTRAVENOUS
  Filled 2017-04-20 (×7): qty 40

## 2017-04-20 MED ORDER — FAMOTIDINE IN NACL 20-0.9 MG/50ML-% IV SOLN
20.0000 mg | Freq: Once | INTRAVENOUS | Status: AC
  Administered 2017-04-20: 20 mg via INTRAVENOUS
  Filled 2017-04-20: qty 50

## 2017-04-20 MED ORDER — B COMPLEX-C PO TABS
1.0000 | ORAL_TABLET | Freq: Every day | ORAL | Status: DC
  Administered 2017-04-20 – 2017-04-24 (×4): 1 via ORAL
  Filled 2017-04-20 (×5): qty 1

## 2017-04-20 MED ORDER — ONDANSETRON HCL 4 MG/2ML IJ SOLN: 4.0000 mg | Freq: Four times a day (QID) | INTRAMUSCULAR | Status: DC | PRN

## 2017-04-20 MED ORDER — PANTOPRAZOLE SODIUM 40 MG IV SOLR
40.0000 mg | Freq: Once | INTRAVENOUS | Status: AC
  Administered 2017-04-20: 40 mg via INTRAVENOUS
  Filled 2017-04-20: qty 40

## 2017-04-20 MED ORDER — ALBUTEROL SULFATE (2.5 MG/3ML) 0.083% IN NEBU: 2.5000 mg | INHALATION_SOLUTION | RESPIRATORY_TRACT | Status: DC | PRN

## 2017-04-20 MED ORDER — INSULIN ASPART 100 UNIT/ML ~~LOC~~ SOLN
0.0000 [IU] | Freq: Three times a day (TID) | SUBCUTANEOUS | Status: DC
  Administered 2017-04-21: 1 [IU] via SUBCUTANEOUS

## 2017-04-20 MED ORDER — SODIUM CHLORIDE 0.9 % IV BOLUS (SEPSIS)
1000.0000 mL | Freq: Once | INTRAVENOUS | Status: AC
  Administered 2017-04-20: 1000 mL via INTRAVENOUS

## 2017-04-20 MED ORDER — POTASSIUM CHLORIDE IN NACL 20-0.9 MEQ/L-% IV SOLN
INTRAVENOUS | Status: DC
  Administered 2017-04-20 – 2017-04-21 (×2): via INTRAVENOUS
  Filled 2017-04-20 (×2): qty 1000

## 2017-04-20 MED ORDER — METHYLPREDNISOLONE SODIUM SUCC 125 MG IJ SOLR
60.0000 mg | Freq: Four times a day (QID) | INTRAMUSCULAR | Status: DC
  Administered 2017-04-20 – 2017-04-21 (×4): 60 mg via INTRAVENOUS
  Filled 2017-04-20: qty 0.96
  Filled 2017-04-20 (×2): qty 2
  Filled 2017-04-20: qty 0.96
  Filled 2017-04-20 (×2): qty 2
  Filled 2017-04-20: qty 0.96

## 2017-04-20 MED ORDER — SODIUM CHLORIDE 0.9 % IV SOLN
250.0000 mL | INTRAVENOUS | Status: DC | PRN
  Administered 2017-04-22: 12:00:00 via INTRAVENOUS

## 2017-04-20 MED ORDER — CEFTRIAXONE SODIUM 1 G IJ SOLR
1.0000 g | INTRAMUSCULAR | Status: DC
  Administered 2017-04-20 – 2017-04-21 (×2): 1 g via INTRAMUSCULAR
  Filled 2017-04-20 (×2): qty 10

## 2017-04-20 MED ORDER — DIPHENHYDRAMINE HCL 50 MG/ML IJ SOLN
25.0000 mg | Freq: Once | INTRAMUSCULAR | Status: AC
  Administered 2017-04-20: 25 mg via INTRAVENOUS
  Filled 2017-04-20: qty 1

## 2017-04-20 MED ORDER — POTASSIUM CHLORIDE 10 MEQ/100ML IV SOLN
10.0000 meq | INTRAVENOUS | Status: AC
  Administered 2017-04-20 (×3): 10 meq via INTRAVENOUS
  Filled 2017-04-20 (×2): qty 100

## 2017-04-20 MED ORDER — INFLUENZA VAC SPLIT QUAD 0.5 ML IM SUSY
0.5000 mL | PREFILLED_SYRINGE | INTRAMUSCULAR | Status: AC
  Administered 2017-04-21: 0.5 mL via INTRAMUSCULAR
  Filled 2017-04-20: qty 0.5

## 2017-04-20 MED ORDER — IPRATROPIUM-ALBUTEROL 0.5-2.5 (3) MG/3ML IN SOLN
3.0000 mL | Freq: Four times a day (QID) | RESPIRATORY_TRACT | Status: DC
  Administered 2017-04-20 (×3): 3 mL via RESPIRATORY_TRACT
  Filled 2017-04-20 (×4): qty 3

## 2017-04-20 MED ORDER — ONDANSETRON HCL 4 MG/2ML IJ SOLN
4.0000 mg | Freq: Once | INTRAMUSCULAR | Status: AC
  Administered 2017-04-20: 4 mg via INTRAVENOUS
  Filled 2017-04-20: qty 2

## 2017-04-20 MED ORDER — METHYLPREDNISOLONE SODIUM SUCC 125 MG IJ SOLR
125.0000 mg | Freq: Once | INTRAMUSCULAR | Status: AC
  Administered 2017-04-20: 125 mg via INTRAVENOUS
  Filled 2017-04-20: qty 2

## 2017-04-20 MED ORDER — THIAMINE HCL 100 MG/ML IJ SOLN
100.0000 mg | Freq: Every day | INTRAMUSCULAR | Status: DC
  Administered 2017-04-20 – 2017-04-23 (×4): 100 mg via INTRAVENOUS
  Filled 2017-04-20: qty 2
  Filled 2017-04-20 (×3): qty 1
  Filled 2017-04-20: qty 2

## 2017-04-20 MED ORDER — HYDRALAZINE HCL 20 MG/ML IJ SOLN: 5.0000 mg | INTRAMUSCULAR | Status: DC | PRN

## 2017-04-20 MED ORDER — EPINEPHRINE 0.3 MG/0.3ML IJ SOAJ
0.3000 mg | Freq: Once | INTRAMUSCULAR | Status: AC
  Administered 2017-04-20: 0.3 mg via INTRAMUSCULAR
  Filled 2017-04-20: qty 0.3

## 2017-04-20 MED ORDER — LORAZEPAM 2 MG/ML IJ SOLN
0.5000 mg | INTRAMUSCULAR | Status: DC | PRN
  Administered 2017-04-20 (×3): 0.5 mg via INTRAVENOUS
  Filled 2017-04-20 (×3): qty 1

## 2017-04-20 NOTE — ED Notes (Signed)
Md Wallace Cullens updated on pt. Plan of care discussed.

## 2017-04-20 NOTE — Progress Notes (Signed)
   Subjective/Chief Complaint: Doing well. vopice normal. Breathing better   Objective: Vital signs in last 24 hours: Temp:  [97.9 F (36.6 C)-98.3 F (36.8 C)] 98.3 F (36.8 C) (11/19 0515) Pulse Rate:  [54-146] 128 (11/19 1500) Resp:  [11-23] 19 (11/19 1500) BP: (116-157)/(73-102) 145/77 (11/19 1500) SpO2:  [92 %-100 %] 100 % (11/19 1500) Weight:  [71.2 kg (157 lb)] 71.2 kg (157 lb) (11/19 0502)    Intake/Output from previous day: No intake/output data recorded. Intake/Output this shift: Total I/O In: 1250 [IV Piggyback:1250] Out: -   awake and alert. resting comfortaably and no stridor. voice normal  Lab Results:  Recent Labs    04/20/17 0515 04/20/17 1220  WBC 14.6* 13.3*  HGB 13.1 11.2*  HCT 37.4 33.5*  PLT 368 314   BMET Recent Labs    04/20/17 0515  NA 138  K 3.3*  CL 103  CO2 21*  GLUCOSE 101*  BUN 6  CREATININE 0.83  CALCIUM 8.7*   PT/INR Recent Labs    04/20/17 0515  LABPROT 19.7*  INR 1.69   ABG No results for input(s): PHART, HCO3 in the last 72 hours.  Invalid input(s): PCO2, PO2  Studies/Results: Dg Neck Soft Tissue  Result Date: 04/20/2017 CLINICAL DATA:  Sore throat and shortness of breath tonight. Vomiting and neck swelling. EXAM: NECK SOFT TISSUES - 1+ VIEW COMPARISON:  CT sinuses 05/06/2010 FINDINGS: Suggestion of mild focal narrowing of the subglottic tracheal airway with prominence of the epiglottis and aryepiglottic folds. This could represent epiglottitis or other inflammatory process. No prevertebral or submental soft tissue swelling. No radiopaque foreign bodies. Degenerative changes in the cervical spine. IMPRESSION: Prominence of the epiglottis and aryepiglottic folds with suggestion of mild focal narrowing of the subglottic tracheal airway. This may indicate epiglottitis or other inflammatory process. Electronically Signed   By: Burman Nieves M.D.   On: 04/20/2017 06:03   Dg Chest Port 1 View  Result Date:  04/20/2017 CLINICAL DATA:  Shortness of breath. Sore throat with swelling. Vomiting. EXAM: PORTABLE CHEST 1 VIEW COMPARISON:  03/11/2016 FINDINGS: Shallow inspiration. Normal heart size and pulmonary vascularity. No focal airspace disease or consolidation in the lungs. No blunting of costophrenic angles. No pneumothorax. Mediastinal contours appear intact. IMPRESSION: No active disease. Electronically Signed   By: Burman Nieves M.D.   On: 04/20/2017 06:12    Anti-infectives: Anti-infectives (From admission, onward)   Start     Dose/Rate Route Frequency Ordered Stop   04/20/17 1000  cefTRIAXone (ROCEPHIN) injection 1 g     1 g Intramuscular Every 24 hours 04/20/17 0847        Assessment/Plan: s/p * No surgery found * seems to be progressing well with improved breathing.   LOS: 0 days    Ruth Charles 04/20/2017

## 2017-04-20 NOTE — Progress Notes (Signed)
Pt has family at bedside. Pt ambulated to [facility] with assistance, tolerated well. Will continue to monitor.

## 2017-04-20 NOTE — ED Notes (Signed)
The patient appears to be in less distress than this morning. Pt states she can feel a difference and feels much better.

## 2017-04-20 NOTE — ED Notes (Signed)
Chicken Broth given to pt

## 2017-04-20 NOTE — ED Triage Notes (Signed)
Pt reports SOB, dysphagia and hematemesis.  This began midnight.  She was treated for a DVT last week.

## 2017-04-20 NOTE — ED Notes (Signed)
Pt offered alternative toileting. Pt refused, walked to [facility] with family assistance. Pt very shaky, steady gait.

## 2017-04-20 NOTE — H&P (Signed)
PULMONARY / CRITICAL CARE MEDICINE   Name: Ruth Charles MRN: [id number] DOB: [birth year]    ADMISSION DATE:  04/20/2017   CHIEF COMPLAINT: Prolonged nausea and vomiting and subsequently severe sore throat.  HISTORY OF PRESENT ILLNESS:   The patient had several hours of nausea and vomiting followed by severe sore throat.  No abdominal pain preceding the nausea and vomiting, she says the vomitus was initially green but then turned red.  She has  not had any black or red stool.  She denies a significant past history of internal bleeding.  She was recently started on Xarelto for a DVT. Pertinent to her severe sore throat a soft tissue lateral film of the neck suggested some supraglottic edema which was confirmed by endoscopy performed by ENT who stated that it was very difficult to even envision her cords.  A significant change in voice is denied.  Respiratory distress.  She does report some subjective fevers and chills but says these have been going on for quite some time.  PAST MEDICAL HISTORY :  She  has a past medical history of Arthritis, Asthma, Bronchitis, Complication of anesthesia, Cough, Diarrhea, Fatigue, Frequent urination at night, GERD (gastroesophageal reflux disease), Gluten intolerance, Lactose intolerance, Mumps, Night sweats, PONV (postoperative nausea and vomiting), Seasonal allergies, Shortness of breath dyspnea, Streptococcal infection, Tinnitus, and Wheezing.  PAST SURGICAL HISTORY: She  has a past surgical history that includes right TMJ; removal right ovary with cyst; Appendectomy; Breast surgery; and Patella-femoral arthroplasty (Left, 11/06/2014).  Allergies  Allergen Reactions  . Corn-Containing Products Other (See Comments)    Chest congestion  . Gluten Meal Other (See Comments)    Nausea and diarrhea  . Lactose Intolerance (Gi) Other (See Comments)    Chest congestion  . Remicade [Infliximab] Shortness Of Breath and Other (See Comments)    "Lightheadedness" also   . Cosentyx [Secukinumab] Other (See Comments)    Worsened symptoms  . Enbrel [Etanercept] Other (See Comments)    Psoriasis worsened  . Humira [Adalimumab] Other (See Comments)    Psoriasis worsened  . Sulfa Antibiotics Hives  . Tape Other (See Comments)    TEARS THE SKIN!! COBAN WRAP IS TOLERATED  . Latex Rash    No current facility-administered medications on file prior to encounter.    Current Outpatient Medications on File Prior to Encounter  Medication Sig  . albuterol (PROVENTIL HFA;VENTOLIN HFA) 108 (90 BASE) MCG/ACT inhaler Inhale 2 puffs into the lungs every 6 (six) hours as needed for wheezing or shortness of breath.   . budesonide-formoterol (SYMBICORT) 160-4.5 MCG/ACT inhaler Inhale 2 puffs into the lungs 2 (two) times daily.  . cetirizine (ZYRTEC) 10 MG tablet Take 10 mg by mouth daily.  . diphenhydrAMINE (BENADRYL) 25 MG tablet Take 25 mg every 6 (six) hours as needed by mouth (for "back up" if Zyrtec is not effective).   . furosemide (LASIX) 40 MG tablet Take 40 mg daily as needed by mouth (for obvious swelling of the ankles).  . norethindrone-ethinyl estradiol-iron (JUNEL FE 1.5/30) 1.5-30 MG-MCG tablet Take 1 tablet See admin instructions by mouth. 1 tablet once a day for three consecutive weeks then OFF for one week  . omeprazole (PRILOSEC) 20 MG capsule Take 1 capsule (20 mg total) by mouth daily.  . Rivaroxaban 15 & 20 MG TBPK Take 15 mg 2 (two) times daily by mouth. Take as directed on package: Start with one 15mg  tablet by mouth twice a day with food. On Day 22, switch to  one 20mg  tablet once a day with food. (Patient taking differently: Take 15 mg 2 (two) times daily by mouth. Take as directed on package: Start with one 15mg  tablet by mouth twice a day with food. On Day 22, switch to one 20mg  tablet once a day with food.  ( on day 6 of therapy))  . traMADol (ULTRAM) 50 MG tablet Take 50 mg every 8 (eight) hours as needed by mouth (for pain).   ALPRAZolam (XANAX)  0.25 MG tablet Take 0.25 mg by mouth 3 (three) times daily as needed for anxiety.  . docusate sodium (COLACE) 100 MG capsule Take 1 capsule (100 mg total) by mouth 2 (two) times daily. (Patient not taking: Reported on 04/13/2017)  . ferrous sulfate 325 (65 FE) MG tablet Take 1 tablet (325 mg total) by mouth 3 (three) times daily after meals. (Patient not taking: Reported on 04/13/2017)  . HYDROcodone-acetaminophen (NORCO) 7.5-325 MG per tablet Take 1-2 tablets by mouth every 4 (four) hours as needed for moderate pain. (Patient not taking: Reported on 04/13/2017)  . methocarbamol (ROBAXIN) 500 MG tablet Take 1 tablet (500 mg total) by mouth every 6 (six) hours as needed for muscle spasms. (Patient not taking: Reported on 04/13/2017)  . polyethylene glycol (MIRALAX / GLYCOLAX) packet Take 17 g by mouth 2 (two) times daily. (Patient not taking: Reported on 04/13/2017)  . predniSONE (DELTASONE) 20 MG tablet Take 2 tablets (40 mg total) by mouth daily. (Patient not taking: Reported on 04/13/2017)    FAMILY HISTORY:  Her has no family status information on file.    SOCIAL HISTORY: She reports 2 glasses of wine a day and a glass of bourbon at night.  She does not smoke.  She does not use street drugs.  REVIEW OF SYSTEMS:   10 system review of systems was performed.  In general her activity is limited by arthritis.  She has no significant CNS history except for one time she had transient weakness of her lower extremities.  She does report a history of asthma and uses a twice daily inhaler with a rescue inhaler which she uses only very rarely.  She denies significant prior GI bleeding.  She has had significant swelling in her lower extremities left greater than right.  She has no history of diabetes or known thyroid disease.  The remainder of the review of systems is noncontributory.  SUBJECTIVE:  As above  VITAL SIGNS: BP (!) 138/95   Pulse (!) 54   Temp 98.3 F (36.8 C) (Oral)   Resp (!) 23   Ht  5\' 4"  (1.626 m)   Wt 157 lb (71.2 kg)   LMP 03/23/2017   SpO2 98%   BMI 26.95 kg/m   HEMODYNAMICS:    VENTILATOR SETTINGS:    INTAKE / OUTPUT: No intake/output data recorded.  PHYSICAL EXAMINATION: General: The patient is in no overt distress. Neuro: She seems to be somewhat withdrawn but is alert and appropriately interactive HEENT: Is no cervical adenopathy.  There is some very slight neck tenderness bilaterally.  There is no stridor. There is no swelling of the lips or tongue, the airway is class I Cardiovascular: S1 and S2 are regular without murmur rub or gallop Lungs: Respirations are unlabored, there are no dependent rales, are no wheezes Abdomen: The abdomen is soft without any organomegaly masses tenderness guarding or rebound.  She is anicteric. Musculoskeletal: There is asymmetric lower extremity edema with trace edema on the right and 1-1/2+ edema on the  left Skin: No hives  LABS:  BMET Recent Labs  Lab 04/13/17 1733 04/20/17 0515  NA 137 138  K 3.7 3.3*  CL 108 103  CO2 20* 21*  BUN 8 6  CREATININE 0.73 0.83  GLUCOSE 79 101*    Electrolytes Recent Labs  Lab 04/13/17 1733 04/20/17 0515  CALCIUM 9.1 8.7*    CBC Recent Labs  Lab 04/13/17 1733 04/20/17 0515  WBC 6.6 14.6*  HGB 11.6* 13.1  HCT 34.0* 37.4  PLT 363 368    Coag's Recent Labs  Lab 04/20/17 0515  APTT 33  INR 1.69    Sepsis Markers No results for input(s): LATICACIDVEN, PROCALCITON, O2SATVEN in the last 168 hours.  ABG No results for input(s): PHART, PCO2ART, PO2ART in the last 168 hours.  Liver Enzymes Recent Labs  Lab 04/20/17 0515  AST 83*  ALT 29  ALKPHOS 66  BILITOT 1.8*  ALBUMIN 3.7    Cardiac Enzymes No results for input(s): TROPONINI, PROBNP in the last 168 hours.  Glucose No results for input(s): GLUCAP in the last 168 hours.  Imaging Dg Neck Soft Tissue  Result Date: 04/20/2017 CLINICAL DATA:  Sore throat and shortness of breath tonight.  Vomiting and neck swelling. EXAM: NECK SOFT TISSUES - 1+ VIEW COMPARISON:  CT sinuses 05/06/2010 FINDINGS: Suggestion of mild focal narrowing of the subglottic tracheal airway with prominence of the epiglottis and aryepiglottic folds. This could represent epiglottitis or other inflammatory process. No prevertebral or submental soft tissue swelling. No radiopaque foreign bodies. Degenerative changes in the cervical spine. IMPRESSION: Prominence of the epiglottis and aryepiglottic folds with suggestion of mild focal narrowing of the subglottic tracheal airway. This may indicate epiglottitis or other inflammatory process. Electronically Signed   By: Burman Nieves M.D.   On: 04/20/2017 06:03   Dg Chest Port 1 View  Result Date: 04/20/2017 CLINICAL DATA:  Shortness of breath. Sore throat with swelling. Vomiting. EXAM: PORTABLE CHEST 1 VIEW COMPARISON:  03/11/2016 FINDINGS: Shallow inspiration. Normal heart size and pulmonary vascularity. No focal airspace disease or consolidation in the lungs. No blunting of costophrenic angles. No pneumothorax. Mediastinal contours appear intact. IMPRESSION: No active disease. Electronically Signed   By: Burman Nieves M.D.   On: 04/20/2017 06:12     DISCUSSION: This is a 46 year old with a history of rheumatoid and psoriatic arthritis and a recently diagnosed DVT for which she was started on Eliquis, who presented with prolonged nausea and vomiting, and subsequent severe sore throat.  Soft tissue film of the neck suggested edema and the patient was examined by ENT who found significant supraglottic edema and difficulty visualizing the cords.  She is being admitted for observation of her airway, but also to manage potential GI bleeding, and to manage her DVT and the clinical situation may require discontinuation of her anticoagulation.  Also has a history of significant alcohol intake and multiple allergies.  ASSESSMENT / PLAN:  PULMONARY A: Airway compromise.  She  does not have stridor at the present time nor does she have alterations in her voice or dyspnea.  I placed her on a combination of steroids and Rocephin in the event that this may represent bacterial epiglottitis.  I am more concerned that this actually represents a a chemical reaction to her protracted vomiting.  He does have a history of asthma and as needed nebulizers have been ordered   CARDIOVASCULAR She is inappropriately bradycardic and a TSH has been ordered.  In addition she is hypertensive and I  note hypertension on multiple previous visits.  A as needed dose of hydralazine has been ordered    GASTROINTESTINAL A: Hematemesis.  I have placed her on a double dose of PPI, and we will be obtaining serial hemoglobins through her admission.  Would like to obtain a upper endoscopy in order to determine whether or not it is safe to resume anticoagulation for her DVT whether we need to have a temporary filter placed.  I am concerned that a significant alcohol history may be further complicating the situation.  I have placed her on thiamine and multivitamins and a as needed dose of Ativan for agitation.    INFECTIOUS A: I very much doubt that her supraglottic edema represents epiglottitis but I have started her on a dose of Rocephin.  ENDOCRINE A: A TSH is pending as noted   Greater than 32 minutes was spent in the care of this patient today   Penny Pia, MD  Critical Care Medicine [facility] Pager: [phone]  04/20/2017, 8:49 AM

## 2017-04-20 NOTE — Progress Notes (Signed)
Pt denies nausea and vomiting at this time. Pt ambulated to bedside commode, tolerated well. Will continue to monitor.

## 2017-04-20 NOTE — ED Notes (Signed)
Pt given ice chips and ice water.

## 2017-04-20 NOTE — ED Provider Notes (Signed)
[facility] EMERGENCY DEPARTMENT Provider Note   CSN: [id number] Arrival date & time: 04/20/17  0402     History   Chief Complaint Chief Complaint  Patient presents with  . Hematemesis  . Dysphagia  . Shortness of Breath    HPI Ruth Charles is a 46 y.o. female.  The history is provided by the patient.  Emesis   This is a new problem. The current episode started 3 to 5 hours ago. Episode frequency: unknown. The problem has been gradually worsening. The emesis has an appearance of bright red blood. There has been no fever. Pertinent negatives include no abdominal pain and no fever.  patient reports onset of vomiting over 5 hrs ago She reports vomiting bright red blood She reports it has been "forceful" vomiting She reports multiple episodes She reports pain in her neck and upper chest She reports it hurts to swallow She reports her voice is changed She just started xarelto for DVT Her last dose was at 2130 on 11/18 She reports shortness of breath  Past Medical History:  Diagnosis Date  . Arthritis   . Asthma   . Bronchitis    hx of   . Complication of anesthesia   . Cough   . Diarrhea   . Fatigue   . Frequent urination at night   . GERD (gastroesophageal reflux disease)   . Gluten intolerance   . Lactose intolerance   . Mumps    hx of   . Night sweats   . PONV (postoperative nausea and vomiting)   . Seasonal allergies   . Shortness of breath dyspnea   . Streptococcal infection    hx of   . Tinnitus   . Wheezing     Patient Active Problem List   Diagnosis Date Noted  . S/P left knee PF replacement 11/06/2014  . Status post unilateral knee replacement 11/06/2014    Past Surgical History:  Procedure Laterality Date  . APPENDECTOMY     2004  . BREAST SURGERY     breast biopsy left   . LEFT KNEE PATELLA-FEMORAL ARTHROPLASTY Left 11/06/2014   Performed by Durene Romans, MD at [facility] ORS  . removal right ovary with cyst     2004  .  right TMJ     2002    OB History    No data available       Home Medications    Prior to Admission medications   Medication Sig Start Date End Date Taking? Authorizing Provider  albuterol (PROVENTIL HFA;VENTOLIN HFA) 108 (90 BASE) MCG/ACT inhaler Inhale 2 puffs into the lungs every 6 (six) hours as needed for wheezing or shortness of breath.     [provider]  ALPRAZolam Prudy Feeler) 0.25 MG tablet Take 0.25 mg by mouth 3 (three) times daily as needed for anxiety.    [provider]  budesonide-formoterol (SYMBICORT) 160-4.5 MCG/ACT inhaler Inhale 2 puffs into the lungs 2 (two) times daily.    [provider]  cetirizine (ZYRTEC) 10 MG tablet Take 10 mg by mouth daily.    [provider]  diphenhydrAMINE (BENADRYL) 25 MG tablet Take 25 mg every 6 (six) hours as needed by mouth (for "back up" if Zyrtec is not effective).     [provider]  docusate sodium (COLACE) 100 MG capsule Take 1 capsule (100 mg total) by mouth 2 (two) times daily. Patient not taking: Reported on 04/13/2017 11/07/14   Lanney Gins, PA-C  ferrous  sulfate 325 (65 FE) MG tablet Take 1 tablet (325 mg total) by mouth 3 (three) times daily after meals. Patient not taking: Reported on 04/13/2017 11/07/14   Lanney GinsBabish, Matthew, PA-C  fluticasone [facility](FLONASE) 50 MCG/ACT nasal spray Place 1 spray into both nostrils daily.    [provider]  furosemide (LASIX) 40 MG tablet Take 40 mg daily as needed by mouth (for obvious swelling of the ankles).    [provider]  HYDROcodone-acetaminophen (NORCO) 7.5-325 MG per tablet Take 1-2 tablets by mouth every 4 (four) hours as needed for moderate pain. Patient not taking: Reported on 04/13/2017 11/07/14   Lanney GinsBabish, Matthew, PA-C  methocarbamol (ROBAXIN) 500 MG tablet Take 1 tablet (500 mg total) by mouth every 6 (six) hours as needed for muscle spasms. Patient not taking: Reported on 04/13/2017 11/07/14   Lanney GinsBabish, Matthew, PA-C    norethindrone-ethinyl estradiol-iron (JUNEL FE 1.5/30) 1.5-30 MG-MCG tablet Take 1 tablet See admin instructions by mouth. 1 tablet once a day for three consecutive weeks then OFF for one week    [provider]  omeprazole (PRILOSEC) 20 MG capsule Take 1 capsule (20 mg total) by mouth daily. 03/11/16   Muthersbaugh, Dahlia ClientHannah, PA-C  polyethylene glycol (MIRALAX / GLYCOLAX) packet Take 17 g by mouth 2 (two) times daily. Patient not taking: Reported on 04/13/2017 11/07/14   Lanney GinsBabish, Matthew, PA-C  predniSONE (DELTASONE) 20 MG tablet Take 2 tablets (40 mg total) by mouth daily. Patient not taking: Reported on 04/13/2017 03/11/16   Muthersbaugh, Boyd KerbsHannah, PA-C  Rivaroxaban 15 & 20 MG TBPK Take 15 mg 2 (two) times daily by mouth. Take as directed on package: Start with one 15mg  tablet by mouth twice a day with food. On Day 22, switch to one 20mg  tablet once a day with food. 04/13/17   Lamont SnowballGarza, Anna, MD  Tofacitinib Citrate (XELJANZ) 5 MG TABS Take 5 mg 2 (two) times daily by mouth.    [provider]  traMADol (ULTRAM) 50 MG tablet Take 50 mg every 8 (eight) hours as needed by mouth (for pain).     [provider]    Family History No family history on file.  Social History Social History   Tobacco Use  . Smoking status: Never Smoker  . Smokeless tobacco: Never Used  Substance Use Topics  . Alcohol use: Yes    Comment: wine and liquor 8-14 glasses per week   . Drug use: No     Allergies   Corn-containing products; Gluten meal; Lactose intolerance (gi); Remicade [infliximab]; Cosentyx [secukinumab]; Enbrel [etanercept]; Humira [adalimumab]; Sulfa antibiotics; Tape; and Latex   Review of Systems Review of Systems  Constitutional: Negative for fever.  HENT: Positive for trouble swallowing and voice change.   Respiratory: Positive for shortness of breath.   Gastrointestinal: Positive for vomiting. Negative for abdominal pain and blood in stool.  All other systems  reviewed and are negative.    Physical Exam Updated Vital Signs BP (!) 147/102   Pulse (!) 111   Temp 98.3 F (36.8 C) (Oral)   Resp 16   Ht 1.626 m (5\' 4" )   Wt 71.2 kg (157 lb)   LMP 03/23/2017   SpO2 98%   BMI 26.95 kg/m   Physical Exam CONSTITUTIONAL: Well developed, anxious, ill appearing HEAD: Normocephalic/atraumatic EYES: EOMI/PERRL ENMT: Mucous membranes moist, uvula midline, minimal erythema, no exudates.  No stridor but voice is hoarse.  No drooling.   NECK: anterior neck is tender to palpation.  No crepitus noted.  ?  edema noted SPINE/BACK:entire spine nontender CV: S1/S2 noted, no murmurs/rubs/gallops noted LUNGS: Lungs are clear to auscultation bilaterally, no apparent distress ABDOMEN: soft, nontender  Rectal - female nurse present for exam, no blood/melena/masses noted GU:no cva tenderness NEURO: Pt is awake/alert/appropriate, moves all extremitiesx4.  No facial droop.   EXTREMITIES: pulses normal/equal, full ROM SKIN: warm, color normal PSYCH: anxious appearing  ED Treatments / Results  Labs (all labs ordered are listed, but only abnormal results are displayed) Labs Reviewed  LIPASE, BLOOD - Abnormal; Notable for the following components:      Result Value   Lipase 65 (*)    All other components within normal limits  COMPREHENSIVE METABOLIC PANEL - Abnormal; Notable for the following components:   Potassium 3.3 (*)    CO2 21 (*)    Glucose, Bld 101 (*)    Calcium 8.7 (*)    AST 83 (*)    Total Bilirubin 1.8 (*)    All other components within normal limits  CBC - Abnormal; Notable for the following components:   WBC 14.6 (*)    RBC 3.30 (*)    MCV 113.3 (*)    MCH 39.7 (*)    RDW 15.8 (*)    All other components within normal limits  PROTIME-INR - Abnormal; Notable for the following components:   Prothrombin Time 19.7 (*)    All other components within normal limits  APTT  POC OCCULT BLOOD, ED  I-STAT BETA HCG BLOOD, ED ([facility], [facility], [facility] ONLY)    TYPE AND SCREEN    EKG ED ECG REPORT   Date: 04/20/2017 0514  Rate: 109  Rhythm: sinus tachycardia  QRS Axis: normal  Intervals: normal  ST/T Wave abnormalities: nonspecific ST changes  Conduction Disutrbances:none  Narrative Interpretation:   Old EKG Reviewed: unchanged  I have personally reviewed the EKG tracing and agree with the computerized printout as noted.   Radiology Dg Neck Soft Tissue  Result Date: 04/20/2017 CLINICAL DATA:  Sore throat and shortness of breath tonight. Vomiting and neck swelling. EXAM: NECK SOFT TISSUES - 1+ VIEW COMPARISON:  CT sinuses 05/06/2010 FINDINGS: Suggestion of mild focal narrowing of the subglottic tracheal airway with prominence of the epiglottis and aryepiglottic folds. This could represent epiglottitis or other inflammatory process. No prevertebral or submental soft tissue swelling. No radiopaque foreign bodies. Degenerative changes in the cervical spine. IMPRESSION: Prominence of the epiglottis and aryepiglottic folds with suggestion of mild focal narrowing of the subglottic tracheal airway. This may indicate epiglottitis or other inflammatory process. Electronically Signed   By: Burman Nieves M.D.   On: 04/20/2017 06:03   Dg Chest Port 1 View  Result Date: 04/20/2017 CLINICAL DATA:  Shortness of breath. Sore throat with swelling. Vomiting. EXAM: PORTABLE CHEST 1 VIEW COMPARISON:  03/11/2016 FINDINGS: Shallow inspiration. Normal heart size and pulmonary vascularity. No focal airspace disease or consolidation in the lungs. No blunting of costophrenic angles. No pneumothorax. Mediastinal contours appear intact. IMPRESSION: No active disease. Electronically Signed   By: Burman Nieves M.D.   On: 04/20/2017 06:12    Procedures Procedures  CRITICAL CARE Performed by: Joya Gaskins Total critical care time: 35 minutes Critical care time was exclusive of separately billable procedures and treating other patients. Critical care was  necessary to treat or prevent imminent or life-threatening deterioration. Critical care was time spent personally by me on the following activities: development of treatment plan with patient and/or surrogate as well as nursing, discussions with consultants, evaluation of patient's  response to treatment, examination of patient, obtaining history from patient or surrogate, ordering and performing treatments and interventions, ordering and review of laboratory studies, ordering and review of radiographic studies, pulse oximetry and re-evaluation of patient's condition. PATIENT WITH ANGIOEDEMA REQUIRING EPIPEN/SOLUMEDROL/BENADRYL AND ENT AND CRITICAL CARE CONSULTATION  Medications Ordered in ED Medications  famotidine (PEPCID) IVPB 20 mg premix (20 mg Intravenous New Bag/Given 04/20/17 0722)  albuterol (PROVENTIL) (2.5 MG/3ML) 0.083% nebulizer solution 5 mg (not administered)  ondansetron (ZOFRAN) injection 4 mg (4 mg Intravenous Given 04/20/17 0534)  pantoprazole (PROTONIX) injection 40 mg (40 mg Intravenous Given 04/20/17 0534)  sodium chloride 0.9 % bolus 1,000 mL (1,000 mLs Intravenous New Bag/Given 04/20/17 0701)  EPINEPHrine (EPI-PEN) injection 0.3 mg (0.3 mg Intramuscular Given 04/20/17 0721)  methylPREDNISolone sodium succinate (SOLU-MEDROL) 125 mg/2 mL injection 125 mg (125 mg Intravenous Given 04/20/17 0721)  diphenhydrAMINE (BENADRYL) injection 25 mg (25 mg Intravenous Given 04/20/17 0721)     Initial Impression / Assessment and Plan / ED Course  I have reviewed the triage vital signs and the nursing notes.  Pertinent labs & imaging results that were available during my care of the patient were reviewed by me and considered in my medical decision making (see chart for details).     6:19 AM Pt with hematemesis and now sore throat/neck pain No signs of acute GI bleed HGB stable However pt with hoarse voice, sore throat Soft tissue neck reveals ?epiglottitis I have spoken to Dr  Pollyann Kennedy with ENT He will come to evaluate patient Pt awake/alert She appears more comfortable No drooling She is protecting her airway at this time 7:23 AM D/W DR ROSEN HE WAS ABLE TO PERFORM FIBEROPTIC LARYNGOSCOPY HE FOUND SUPRAGLOTTIC EDEMA, LIKELY DUE TO ACID BURN FROM EXCESSIVE VOMITING HE DOES NOT FEEL THIS IS INFECTIOUS IN NATURE HE RECOMMENDS TREATING FOR ANGIOEDEMA AND MONITORING IN ICU D/W DR Molli Knock WITH ICU FOR CONSULTATION  SIGNED OUT TO DR NANAVATI PENDING ADMISSION  Final Clinical Impressions(s) / ED Diagnoses   Final diagnoses:  Supraglottic edema  Angioedema, initial encounter  Hematemesis with nausea    ED Discharge Orders    None       Zadie Rhine, MD 04/20/17 1610

## 2017-04-20 NOTE — ED Provider Notes (Signed)
  Physical Exam  BP 116/74   Pulse (!) 118   Temp 98.3 F (36.8 C) (Oral)   Resp 19   Ht 5\' 4"  (1.626 m)   Wt 71.2 kg (157 lb)   LMP 03/23/2017   SpO2 100%   BMI 26.95 kg/m   Physical Exam  ED Course  Procedures  MDM  Pt has supraglottic edema - confirmed by ENT. She is on Xarelto for DVT. ENT requests admission - which is pending. PT is comfortable.     03/25/2017, MD 04/22/17 2001

## 2017-04-20 NOTE — Consult Note (Addendum)
Reason for Consult: Sore throat Referring Physician: Ripley Fraise, MD  Ruth Charles is an 46 y.o. female.  HPI: Last night at 11:00 she began vomiting and developed a severe sore throat.  She was vomiting some blood.  She was fine prior to that.  She has been on anticoagulation therapy for about 1 week for recently diagnosed DVT.  She does not smoke but she drinks about 2 or 3 servings of alcohol every evening.  She suffers with chronic acid reflux.  She takes PPI therapy for that.  Right now she is breathing okay but she finds it very difficult to swallow.  The sore throat is bilateral.  She has ear pain, little bit worse on the left.No history of known food allergies.   Past Medical History:  Diagnosis Date  . Arthritis   . Asthma   . Bronchitis    hx of   . Complication of anesthesia   . Cough   . Diarrhea   . Fatigue   . Frequent urination at night   . GERD (gastroesophageal reflux disease)   . Gluten intolerance   . Lactose intolerance   . Mumps    hx of   . Night sweats   . PONV (postoperative nausea and vomiting)   . Seasonal allergies   . Shortness of breath dyspnea   . Streptococcal infection    hx of   . Tinnitus   . Wheezing     Past Surgical History:  Procedure Laterality Date  . APPENDECTOMY     2004  . BREAST SURGERY     breast biopsy left   . LEFT KNEE PATELLA-FEMORAL ARTHROPLASTY Left 11/06/2014   Performed by Paralee Cancel, MD at [facility] ORS  . removal right ovary with cyst     2004  . right TMJ     2002    No family history on file.  Social History:  reports that  has never smoked. she has never used smokeless tobacco. She reports that she drinks alcohol. She reports that she does not use drugs.  Allergies:  Allergies  Allergen Reactions  . Corn-Containing Products Other (See Comments)    Chest congestion  . Gluten Meal Other (See Comments)    Nausea and diarrhea  . Lactose Intolerance (Gi) Other (See Comments)    Chest congestion  .  Remicade [Infliximab] Shortness Of Breath and Other (See Comments)    "Lightheadedness" also  . Cosentyx [Secukinumab] Other (See Comments)    Worsened symptoms  . Enbrel [Etanercept] Other (See Comments)    Psoriasis worsened  . Humira [Adalimumab] Other (See Comments)    Psoriasis worsened  . Sulfa Antibiotics Hives  . Tape Other (See Comments)    TEARS THE SKIN!! COBAN WRAP IS TOLERATED  . Latex Rash    Medications: Reviewed  Results for orders placed or performed during the hospital encounter of 04/20/17 (from the past 48 hour(s))  Lipase, blood     Status: Abnormal   Collection Time: 04/20/17  5:15 AM  Result Value Ref Range   Lipase 65 (H) 11 - 51 U/L  Comprehensive metabolic panel     Status: Abnormal   Collection Time: 04/20/17  5:15 AM  Result Value Ref Range   Sodium 138 135 - 145 mmol/L   Potassium 3.3 (L) 3.5 - 5.1 mmol/L   Chloride 103 101 - 111 mmol/L   CO2 21 (L) 22 - 32 mmol/L   Glucose, Bld 101 (H) 65 - 99 mg/dL  BUN 6 6 - 20 mg/dL   Creatinine, Ser 0.83 0.44 - 1.00 mg/dL   Calcium 8.7 (L) 8.9 - 10.3 mg/dL   Total Protein 7.2 6.5 - 8.1 g/dL   Albumin 3.7 3.5 - 5.0 g/dL   AST 83 (H) 15 - 41 U/L   ALT 29 14 - 54 U/L   Alkaline Phosphatase 66 38 - 126 U/L   Total Bilirubin 1.8 (H) 0.3 - 1.2 mg/dL   GFR calc non Af Amer >60 >60 mL/min   GFR calc Af Amer >60 >60 mL/min    Comment: (NOTE) The eGFR has been calculated using the CKD EPI equation. This calculation has not been validated in all clinical situations. eGFR's persistently <60 mL/min signify possible Chronic Kidney Disease.    Anion gap 14 5 - 15  CBC     Status: Abnormal   Collection Time: 04/20/17  5:15 AM  Result Value Ref Range   WBC 14.6 (H) 4.0 - 10.5 K/uL   RBC 3.30 (L) 3.87 - 5.11 MIL/uL   Hemoglobin 13.1 12.0 - 15.0 g/dL   HCT 37.4 36.0 - 46.0 %   MCV 113.3 (H) 78.0 - 100.0 fL   MCH 39.7 (H) 26.0 - 34.0 pg   MCHC 35.0 30.0 - 36.0 g/dL   RDW 15.8 (H) 11.5 - 15.5 %   Platelets 368  150 - 400 K/uL  APTT     Status: None   Collection Time: 04/20/17  5:15 AM  Result Value Ref Range   aPTT 33 24 - 36 seconds  Protime-INR     Status: Abnormal   Collection Time: 04/20/17  5:15 AM  Result Value Ref Range   Prothrombin Time 19.7 (H) 11.4 - 15.2 seconds   INR 1.69   I-Stat Beta hCG blood, ED ([facility], [facility], [facility] only)     Status: None   Collection Time: 04/20/17  5:28 AM  Result Value Ref Range   I-stat hCG, quantitative <5.0 <5 mIU/mL   Comment 3            Comment:   GEST. AGE      CONC.  (mIU/mL)   <=1 WEEK        5 - 50     2 WEEKS       50 - 500     3 WEEKS       100 - 10,000     4 WEEKS     1,000 - 30,000        FEMALE AND NON-PREGNANT FEMALE:     LESS THAN 5 mIU/mL   POC occult blood, ED     Status: None   Collection Time: 04/20/17  5:32 AM  Result Value Ref Range   Fecal Occult Bld NEGATIVE NEGATIVE  Type and screen     Status: None   Collection Time: 04/20/17  5:55 AM  Result Value Ref Range   ABO/RH(D) B NEG    Antibody Screen NEG    Sample Expiration 04/23/2017     Dg Neck Soft Tissue  Result Date: 04/20/2017 CLINICAL DATA:  Sore throat and shortness of breath tonight. Vomiting and neck swelling. EXAM: NECK SOFT TISSUES - 1+ VIEW COMPARISON:  CT sinuses 05/06/2010 FINDINGS: Suggestion of mild focal narrowing of the subglottic tracheal airway with prominence of the epiglottis and aryepiglottic folds. This could represent epiglottitis or other inflammatory process. No prevertebral or submental soft tissue swelling. No radiopaque foreign bodies. Degenerative changes in the cervical spine. IMPRESSION: Prominence  of the epiglottis and aryepiglottic folds with suggestion of mild focal narrowing of the subglottic tracheal airway. This may indicate epiglottitis or other inflammatory process. Electronically Signed   By: Lucienne Capers M.D.   On: 04/20/2017 06:03   Dg Chest Port 1 View  Result Date: 04/20/2017 CLINICAL DATA:  Shortness of breath. Sore throat with  swelling. Vomiting. EXAM: PORTABLE CHEST 1 VIEW COMPARISON:  03/11/2016 FINDINGS: Shallow inspiration. Normal heart size and pulmonary vascularity. No focal airspace disease or consolidation in the lungs. No blunting of costophrenic angles. No pneumothorax. Mediastinal contours appear intact. IMPRESSION: No active disease. Electronically Signed   By: Lucienne Capers M.D.   On: 04/20/2017 06:12    HRC:BULAGTXM except as listed in admit H&P  Blood pressure 116/74, pulse (!) 118, temperature 98.3 F (36.8 C), temperature source Oral, resp. rate 19, height 5' 4"  (1.626 m), weight 71.2 kg (157 lb), last menstrual period 03/23/2017, SpO2 100 %.  PHYSICAL EXAM: Overall appearance:  Healthy appearing, in no distress breathing okay although when she takes a deep inspiration, she has a slight rattling noise.  She has a moderate "hot potato" voice. Head:  Normocephalic, atraumatic. Ears: External ears look healthy. Nose: External nose is healthy in appearance. Internal nasal exam free of any lesions or obstruction. Oral Cavity/Pharynx:  There are no mucosal lesions or masses identified.  She has a very strong gag reflex.  Pharynx looks clear without any edema. Larynx/Hypopharynx: See below Neuro:  No identifiable neurologic deficits. Neck: No palpable neck masses.  Studies Reviewed: Soft tissue neck x-ray reveals significant edema of the arytenoids.  Procedures: Flexible fiberoptic laryngoscopy.  Topical/lidocaine spray was tried to the nasal cavities.  The scope was passed through the right side.  Nasal cavity looks clear.  Nasopharynx contains a small pale area at the midline, possible burn or ulceration.  Otherwise is clear.  Oropharynx and hypopharynx are clear.  Larynx reveals significant, pale  edema of the supraglottic structures, especially the arytenoids. There is no erythema. The cords are barely visible.  There are no retained secretions.   Assessment/Plan: Supraglottic edema, consistent  with either angioedema or possible acid burn injury from vomiting.  Allergic reaction is also a possibility.  Recommend emergent admission to the intensive care unit for medical management and medical treatment for angioedema.  We discussed that airway intervention is a possibility if she gets any worse.  This is not appear to be infectious although she has a mild leukocytosis.  She was on a brief course of a an immune modulator for psoriatic arthritis but that was stopped when she was diagnosed with DVT.  Ruth Charles 04/20/2017, 6:52 AM

## 2017-04-21 DIAGNOSIS — K922 Gastrointestinal hemorrhage, unspecified: Secondary | ICD-10-CM

## 2017-04-21 LAB — MAGNESIUM: Magnesium: 1.7 mg/dL (ref 1.7–2.4)

## 2017-04-21 LAB — CBC WITH DIFFERENTIAL/PLATELET
BASOS ABS: 0 10*3/uL (ref 0.0–0.1)
BASOS PCT: 0 %
BASOS PCT: 0 %
Basophils Absolute: 0 10*3/uL (ref 0.0–0.1)
Basophils Absolute: 0 10*3/uL (ref 0.0–0.1)
Basophils Relative: 0 %
EOS ABS: 0 10*3/uL (ref 0.0–0.7)
EOS PCT: 0 %
EOS PCT: 0 %
Eosinophils Absolute: 0 10*3/uL (ref 0.0–0.7)
Eosinophils Absolute: 0 10*3/uL (ref 0.0–0.7)
Eosinophils Relative: 0 %
HCT: 31.1 % — ABNORMAL LOW (ref 36.0–46.0)
HEMATOCRIT: 29.5 % — AB (ref 36.0–46.0)
HEMATOCRIT: 31 % — AB (ref 36.0–46.0)
HEMOGLOBIN: 10.5 g/dL — AB (ref 12.0–15.0)
Hemoglobin: 10 g/dL — ABNORMAL LOW (ref 12.0–15.0)
Hemoglobin: 10.3 g/dL — ABNORMAL LOW (ref 12.0–15.0)
LYMPHS ABS: 0.2 10*3/uL — AB (ref 0.7–4.0)
LYMPHS ABS: 0.2 10*3/uL — AB (ref 0.7–4.0)
LYMPHS PCT: 6 %
Lymphocytes Relative: 2 %
Lymphocytes Relative: 1 %
Lymphs Abs: 0.8 10*3/uL (ref 0.7–4.0)
MCH: 39.2 pg — ABNORMAL HIGH (ref 26.0–34.0)
MCH: 39.5 pg — AB (ref 26.0–34.0)
MCH: 38.6 pg — AB (ref 26.0–34.0)
MCHC: 33.9 g/dL (ref 30.0–36.0)
MCHC: 33.8 g/dL (ref 30.0–36.0)
MCHC: 33.2 g/dL (ref 30.0–36.0)
MCV: 115.7 fL — ABNORMAL HIGH (ref 78.0–100.0)
MCV: 116.9 fL — AB (ref 78.0–100.0)
MCV: 116.1 fL — AB (ref 78.0–100.0)
MONO ABS: 0.6 10*3/uL (ref 0.1–1.0)
MONO ABS: 0.2 10*3/uL (ref 0.1–1.0)
MONO ABS: 0.5 10*3/uL (ref 0.1–1.0)
MONOS PCT: 3 %
Monocytes Relative: 2 %
Monocytes Relative: 4 %
NEUTROS ABS: 11.3 10*3/uL — AB (ref 1.7–7.7)
NEUTROS ABS: 15.3 10*3/uL — AB (ref 1.7–7.7)
NEUTROS PCT: 90 %
Neutro Abs: 12.4 10*3/uL — ABNORMAL HIGH (ref 1.7–7.7)
Neutrophils Relative %: 96 %
Neutrophils Relative %: 96 %
PLATELETS: 279 10*3/uL (ref 150–400)
PLATELETS: 270 10*3/uL (ref 150–400)
PLATELETS: 284 10*3/uL (ref 150–400)
RBC: 2.67 MIL/uL — ABNORMAL LOW (ref 3.87–5.11)
RBC: 2.66 MIL/uL — AB (ref 3.87–5.11)
RBC: 2.55 MIL/uL — AB (ref 3.87–5.11)
RDW: 16.5 % — AB (ref 11.5–15.5)
RDW: 16.6 % — ABNORMAL HIGH (ref 11.5–15.5)
RDW: 16.5 % — AB (ref 11.5–15.5)
WBC: 13.8 10*3/uL — AB (ref 4.0–10.5)
WBC: 11.7 10*3/uL — AB (ref 4.0–10.5)
WBC: 16 10*3/uL — AB (ref 4.0–10.5)

## 2017-04-21 LAB — COMPREHENSIVE METABOLIC PANEL
ALT: 25 U/L (ref 14–54)
ANION GAP: 10 (ref 5–15)
AST: 43 U/L — ABNORMAL HIGH (ref 15–41)
Albumin: 3.3 g/dL — ABNORMAL LOW (ref 3.5–5.0)
Alkaline Phosphatase: 52 U/L (ref 38–126)
BUN: 5 mg/dL — ABNORMAL LOW (ref 6–20)
CALCIUM: 7.7 mg/dL — AB (ref 8.9–10.3)
CHLORIDE: 105 mmol/L (ref 101–111)
CO2: 20 mmol/L — ABNORMAL LOW (ref 22–32)
CREATININE: 0.73 mg/dL (ref 0.44–1.00)
Glucose, Bld: 115 mg/dL — ABNORMAL HIGH (ref 65–99)
Potassium: 3.4 mmol/L — ABNORMAL LOW (ref 3.5–5.1)
Sodium: 135 mmol/L (ref 135–145)
Total Bilirubin: 1.9 mg/dL — ABNORMAL HIGH (ref 0.3–1.2)
Total Protein: 6.1 g/dL — ABNORMAL LOW (ref 6.5–8.1)

## 2017-04-21 LAB — GLUCOSE, CAPILLARY
GLUCOSE-CAPILLARY: 131 mg/dL — AB (ref 65–99)
GLUCOSE-CAPILLARY: 89 mg/dL (ref 65–99)
GLUCOSE-CAPILLARY: 88 mg/dL (ref 65–99)
Glucose-Capillary: 126 mg/dL — ABNORMAL HIGH (ref 65–99)
Glucose-Capillary: 132 mg/dL — ABNORMAL HIGH (ref 65–99)

## 2017-04-21 LAB — HIV ANTIBODY (ROUTINE TESTING W REFLEX): HIV SCREEN 4TH GENERATION: NONREACTIVE

## 2017-04-21 LAB — PHOSPHORUS: PHOSPHORUS: 1.9 mg/dL — AB (ref 2.5–4.6)

## 2017-04-21 MED ORDER — POTASSIUM CHLORIDE 10 MEQ/100ML IV SOLN
10.0000 meq | INTRAVENOUS | Status: AC
  Administered 2017-04-21 (×2): 10 meq via INTRAVENOUS
  Filled 2017-04-21 (×2): qty 100

## 2017-04-21 MED ORDER — FOLIC ACID 5 MG/ML IJ SOLN
1.0000 mg | Freq: Every day | INTRAMUSCULAR | Status: DC
  Administered 2017-04-21 – 2017-04-23 (×3): 1 mg via INTRAVENOUS
  Filled 2017-04-21 (×3): qty 0.2

## 2017-04-21 MED ORDER — SODIUM GLYCEROPHOSPHATE 1 MMOLE/ML IV SOLN
20.0000 mmol | Freq: Once | INTRAVENOUS | Status: AC
  Administered 2017-04-21: 20 mmol via INTRAVENOUS
  Filled 2017-04-21: qty 20

## 2017-04-21 MED ORDER — MAGNESIUM SULFATE 2 GM/50ML IV SOLN: 2.0000 g | Freq: Once | INTRAVENOUS | Status: DC

## 2017-04-21 MED ORDER — IPRATROPIUM-ALBUTEROL 0.5-2.5 (3) MG/3ML IN SOLN
3.0000 mL | Freq: Two times a day (BID) | RESPIRATORY_TRACT | Status: DC
  Administered 2017-04-21 – 2017-04-23 (×5): 3 mL via RESPIRATORY_TRACT
  Filled 2017-04-21 (×6): qty 3

## 2017-04-21 MED ORDER — LIDOCAINE HCL (PF) 1 % IJ SOLN
INTRAMUSCULAR | Status: AC
  Administered 2017-04-21: 5 mL
  Filled 2017-04-21: qty 5

## 2017-04-21 MED ORDER — SODIUM CHLORIDE 0.9 % IV SOLN
INTRAVENOUS | Status: DC
  Administered 2017-04-21: 13:00:00 via INTRAVENOUS

## 2017-04-21 MED ORDER — LORAZEPAM 2 MG/ML IJ SOLN: 1.0000 mg | Freq: Four times a day (QID) | INTRAMUSCULAR | Status: DC | PRN

## 2017-04-21 MED ORDER — LORAZEPAM 1 MG PO TABS: 1.0000 mg | ORAL_TABLET | Freq: Four times a day (QID) | ORAL | Status: DC | PRN

## 2017-04-21 MED ORDER — INSULIN ASPART 100 UNIT/ML ~~LOC~~ SOLN
0.0000 [IU] | SUBCUTANEOUS | Status: DC
  Administered 2017-04-21 – 2017-04-22 (×2): 1 [IU] via SUBCUTANEOUS

## 2017-04-21 NOTE — Consult Note (Signed)
Chief Complaint: Patient was seen in consultation today for retrievable inferior vena cava filter placement Chief Complaint  Patient presents with  . Hematemesis  . Dysphagia  . Shortness of Breath   at the request of Dr Jamison Neighbor  Supervising Physician: Irish Lack  Patient Status: [facility] - In-pt  History of Present Illness: Ruth Charles is a 46 y.o. female   Dx RLE DVT 11/12: IMPRESSION: There is extensive DVT throughout the right common femoral, femoral, and popliteal veins. It is partially occlusive in the common femoral vein but occlusive in the femoral and popliteal veins  Started on anticoagulation Last night started with N/V Hematemesis And to ED 11/19 Anticoagulation stopped  Supraglottic edema per ENT For GI scope next 48 hrs  Request now for retrievable Inferior vena cava filter placement per Dr Jamison Neighbor Dr Fredia Sorrow approves procedure    Past Medical History:  Diagnosis Date  . Arthritis   . Asthma   . Bronchitis    hx of   . Complication of anesthesia   . Cough   . Diarrhea   . Fatigue   . Frequent urination at night   . GERD (gastroesophageal reflux disease)   . Gluten intolerance   . Lactose intolerance   . Mumps    hx of   . Night sweats   . PONV (postoperative nausea and vomiting)   . Seasonal allergies   . Shortness of breath dyspnea   . Streptococcal infection    hx of   . Tinnitus   . Wheezing     Past Surgical History:  Procedure Laterality Date  . APPENDECTOMY     2004  . BREAST SURGERY     breast biopsy left   . PATELLA-FEMORAL ARTHROPLASTY Left 11/06/2014   Procedure: LEFT KNEE PATELLA-FEMORAL ARTHROPLASTY;  Surgeon: Durene Romans, MD;  Location: [facility];  Service: Orthopedics;  Laterality: Left;  . removal right ovary with cyst     2004  . right TMJ     2002    Allergies: Corn-containing products; Gluten meal; Lactose intolerance (gi); Remicade [infliximab]; Cosentyx [secukinumab]; Enbrel [etanercept]; Humira  [adalimumab]; Sulfa antibiotics; Tape; and Latex  Medications: Prior to Admission medications   Medication Sig Start Date End Date Taking? Authorizing Provider  albuterol (PROVENTIL HFA;VENTOLIN HFA) 108 (90 BASE) MCG/ACT inhaler Inhale 2 puffs into the lungs every 6 (six) hours as needed for wheezing or shortness of breath.    Yes [provider]  budesonide-formoterol (SYMBICORT) 160-4.5 MCG/ACT inhaler Inhale 2 puffs into the lungs 2 (two) times daily.   Yes [provider]  cetirizine (ZYRTEC) 10 MG tablet Take 10 mg by mouth daily.   Yes [provider]  diphenhydrAMINE (BENADRYL) 25 MG tablet Take 25 mg every 6 (six) hours as needed by mouth (for "back up" if Zyrtec is not effective).    Yes [provider]  furosemide (LASIX) 40 MG tablet Take 40 mg daily as needed by mouth (for obvious swelling of the ankles).   Yes [provider]  norethindrone-ethinyl estradiol-iron (JUNEL FE 1.5/30) 1.5-30 MG-MCG tablet Take 1 tablet See admin instructions by mouth. 1 tablet once a day for three consecutive weeks then OFF for one week   Yes [provider]  omeprazole (PRILOSEC) 20 MG capsule Take 1 capsule (20 mg total) by mouth daily. 03/11/16  Yes Muthersbaugh, Boyd Kerbs  Rivaroxaban 15 & 20 MG TBPK Take 15 mg 2 (two) times daily by mouth. Take as  directed on package: Start with one 15mg  tablet by mouth twice a day with food. On Day 22, switch to one 20mg  tablet once a day with food. Patient taking differently: Take 15 mg 2 (two) times daily by mouth. Take as directed on package: Start with one 15mg  tablet by mouth twice a day with food. On Day 22, switch to one 20mg  tablet once a day with food.  ( on day 6 of therapy) 04/13/17  Yes Lamont SnowballGarza, Anna, MD  traMADol (ULTRAM) 50 MG tablet Take 50 mg every 8 (eight) hours as needed by mouth (for pain).    Yes [provider]  ALPRAZolam (XANAX) 0.25 MG tablet Take 0.25 mg by mouth 3 (three) times  daily as needed for anxiety.    [provider]  docusate sodium (COLACE) 100 MG capsule Take 1 capsule (100 mg total) by mouth 2 (two) times daily. Patient not taking: Reported on 04/13/2017 11/07/14   Lanney GinsBabish, Matthew, PA-C  ferrous sulfate 325 (65 FE) MG tablet Take 1 tablet (325 mg total) by mouth 3 (three) times daily after meals. Patient not taking: Reported on 04/13/2017 11/07/14   Lanney GinsBabish, Matthew, PA-C  HYDROcodone-acetaminophen (NORCO) 7.5-325 MG per tablet Take 1-2 tablets by mouth every 4 (four) hours as needed for moderate pain. Patient not taking: Reported on 04/13/2017 11/07/14   Lanney GinsBabish, Matthew, PA-C  methocarbamol (ROBAXIN) 500 MG tablet Take 1 tablet (500 mg total) by mouth every 6 (six) hours as needed for muscle spasms. Patient not taking: Reported on 04/13/2017 11/07/14   Lanney GinsBabish, Matthew, PA-C  polyethylene glycol (MIRALAX / Ethelene HalGLYCOLAX) packet Take 17 g by mouth 2 (two) times daily. Patient not taking: Reported on 04/13/2017 11/07/14   Lanney GinsBabish, Matthew, PA-C  predniSONE (DELTASONE) 20 MG tablet Take 2 tablets (40 mg total) by mouth daily. Patient not taking: Reported on 04/13/2017 03/11/16   Muthersbaugh, Dahlia ClientHannah, PA-C     History reviewed. No pertinent family history.  Social History   Socioeconomic History  . Marital status: Married    Spouse name: None  . Number of children: None  . Years of education: None  . Highest education level: None  Social Needs  . Financial resource strain: None  . Food insecurity - worry: None  . Food insecurity - inability: None  . Transportation needs - medical: None  . Transportation needs - non-medical: None  Occupational History  . None  Tobacco Use  . Smoking status: Never Smoker  . Smokeless tobacco: Never Used  Substance and Sexual Activity  . Alcohol use: Yes    Comment: wine and liquor 8-14 glasses per week   . Drug use: No  . Sexual activity: None  Other Topics Concern  . None  Social History Narrative  . None     Review of Systems: A 12 point ROS discussed and pertinent positives are indicated in the HPI above.  All other systems are negative.  Review of Systems  Constitutional: Positive for activity change and fatigue. Negative for fever.  Respiratory: Negative for cough and shortness of breath.   Cardiovascular: Negative for chest pain.  Neurological: Positive for weakness.  Psychiatric/Behavioral: Negative for behavioral problems and confusion.    Vital Signs: BP (!) 146/99   Pulse 98   Temp 98.5 F (36.9 C) (Oral)   Resp 19   Ht 5\' 4"  (1.626 m)   Wt 164 lb 7.4 oz (74.6 kg)   LMP 03/23/2017   SpO2 94%   BMI 28.23 kg/m   Physical  Exam  Constitutional: She is oriented to person, place, and time.  Cardiovascular: Normal rate, regular rhythm and normal heart sounds.  Pulmonary/Chest: Effort normal.  Abdominal: Soft. Bowel sounds are normal.  Musculoskeletal: Normal range of motion.  Neurological: She is alert and oriented to person, place, and time.  Skin: Skin is warm and dry.  Psychiatric: She has a normal mood and affect. Her behavior is normal.  Nursing note and vitals reviewed.   Imaging: Dg Neck Soft Tissue  Result Date: 04/20/2017 CLINICAL DATA:  Sore throat and shortness of breath tonight. Vomiting and neck swelling. EXAM: NECK SOFT TISSUES - 1+ VIEW COMPARISON:  CT sinuses 05/06/2010 FINDINGS: Suggestion of mild focal narrowing of the subglottic tracheal airway with prominence of the epiglottis and aryepiglottic folds. This could represent epiglottitis or other inflammatory process. No prevertebral or submental soft tissue swelling. No radiopaque foreign bodies. Degenerative changes in the cervical spine. IMPRESSION: Prominence of the epiglottis and aryepiglottic folds with suggestion of mild focal narrowing of the subglottic tracheal airway. This may indicate epiglottitis or other inflammatory process. Electronically Signed   By: Burman Nieves M.D.   On: 04/20/2017  06:03   US Venous Img Lower Unilateral Right  Result Date: 04/13/2017 CLINICAL DATA:  Right lower extremity swelling and edema for 3 weeks. There has been on and off swelling for 3 years. EXAM: RIGHT LOWER EXTREMITY VENOUS DUPLEX ULTRASOUND TECHNIQUE: Doppler venous assessment of the right lower extremity deep venous system was performed, including characterization of spectral flow, compressibility, and phasicity. COMPARISON:  None. FINDINGS: The right common femoral vein is partially compressible and contains partially occlusive DVT. Partially occlusive thrombus does extend into the greater saphenous vein at the saphenous femoral junction. The femoral and popliteal veins are noncompressible compatible with occlusive thrombus. The profunda femoral vein is also an noncompressible compatible with occlusive thrombus. There is occlusive thrombus in the posterior tibial veins. Subcutaneous edema in the calf is noted. The greater saphenous vein beyond the saphenous femoral junction is compressible. IMPRESSION: There is extensive DVT throughout the right common femoral, femoral, and popliteal veins. It is partially occlusive in the common femoral vein but occlusive in the femoral and popliteal veins. Electronically Signed   By: Jolaine Click M.D.   On: 04/13/2017 14:49   Dg Chest Port 1 View  Result Date: 04/20/2017 CLINICAL DATA:  Shortness of breath. Sore throat with swelling. Vomiting. EXAM: PORTABLE CHEST 1 VIEW COMPARISON:  03/11/2016 FINDINGS: Shallow inspiration. Normal heart size and pulmonary vascularity. No focal airspace disease or consolidation in the lungs. No blunting of costophrenic angles. No pneumothorax. Mediastinal contours appear intact. IMPRESSION: No active disease. Electronically Signed   By: Burman Nieves M.D.   On: 04/20/2017 06:12    Labs:  CBC: Recent Labs    04/20/17 1220 04/20/17 1859 04/21/17 0042 04/21/17 0748  WBC 13.3* 10.5 11.7* 16.0*  HGB 11.2* 10.8* 10.0* 10.3*   HCT 33.5* 32.5* 29.5* 31.0*  PLT 314 301 270 284    COAGS: Recent Labs    04/20/17 0515  INR 1.69  APTT 33    BMP: Recent Labs    04/13/17 1733 04/20/17 0515 04/21/17 0514  NA 137 138 135  K 3.7 3.3* 3.4*  CL 108 103 105  CO2 20* 21* 20*  GLUCOSE 79 101* 115*  BUN 8 6 5*  CALCIUM 9.1 8.7* 7.7*  CREATININE 0.73 0.83 0.73  GFRNONAA >60 >60 >60  GFRAA >60 >60 >60    LIVER FUNCTION TESTS: Recent Labs  04/20/17 0515 04/21/17 0514  BILITOT 1.8* 1.9*  AST 83* 43*  ALT 29 25  ALKPHOS 66 52  PROT 7.2 6.1*  ALBUMIN 3.7 3.3*    TUMOR MARKERS: No results for input(s): AFPTM, CEA, CA199, CHROMGRNA in the last 8760 hours.  Assessment and Plan:  New dx R DVT 04/13/17 Anticoagulation ensued Developed N/V and hematemesis Plan for Retrievable IVC filter asap Risks and benefits discussed with the patient including, but not limited to bleeding, infection, contrast induced renal failure, filter fracture or migration which can lead to emergency surgery or even death, strut penetration with damage or irritation to adjacent structures and caval thrombosis. All of the patient's questions were answered, patient is agreeable to proceed. Consent signed and in chart.  Thank you for this interesting consult.  I greatly enjoyed meeting SOKHA CRAKER and look forward to participating in their care.  A copy of this report was sent to the requesting provider on this date.  Electronically Signed: Robet Leu, PA-C 04/21/2017, 1:46 PM   I spent a total of 40 Minutes    in face to face in clinical consultation, greater than 50% of which was counseling/coordinating care for retrievable IVC filter

## 2017-04-21 NOTE — Progress Notes (Addendum)
Patient ID: Lucky Rathke, female   DOB: 10-Dec-1970, 46 y.o.   MRN: [id number]   Request for retrievable inferior vena cava filter placement  Pt dx Rt DVT 04/13/17 - see chart  Started on anticoagulation for same Developed N/V Hematemesis  To ED 11/19  Supraglottic edema per ENT Anticoagulation stopped  Now better ENT clears pt for now  Plan for scope next 48 hrs  Dr Jamison Neighbor requesting IVC filter   I discussed with Dr Fredia Sorrow Rec: await results of scope If cannot anticoagulate--- will move ahead with filter If can resume anticoagulation----would not place filter  Dr Jamison Neighbor aware Can call Dr Fredia Sorrow if he feels necessary   ADDENDUM:  Dr Jamison Neighbor and Dr Fredia Sorrow have rediscussed this pt Will move forward today or tomorrow.Marland KitchenMarland Kitchen

## 2017-04-21 NOTE — Progress Notes (Signed)
Pt is resting with eyes closed. Nonlabored breathing noted. Pt is in SR 76 at this time. Oxygen saturation is mid 90's on room air. Will continue to monitor.

## 2017-04-21 NOTE — Progress Notes (Signed)
I spoke with Dr. Arsenio Loader. New orders given, see MAR. I tried to get a second IV with two failed attempts. I consulted IV therapy. Potassium replacement started.

## 2017-04-21 NOTE — Progress Notes (Signed)
Patient ID: Ruth Charles, female   DOB: August 02, 1970, 46 y.o.   MRN: [id number] Doing much better from an upper airway perspective.  Her voice is normal.  Her breathing is completely clear.  Her swallowing is just about back to normal.  No further intervention necessary for the supraglottic edema.  Follow-up as needed.

## 2017-04-21 NOTE — Progress Notes (Signed)
[facility] Attending Note  ADMISSION DATE:  04/20/2017  Consulting Physician:  Ripley Fraise, M.D. / EDP  Reason for Admission:  Airway Edema  Presenting HPI:  46 y.o. female with long-standing history of alcohol use. Recently diagnosed with DVT on 11/12 & started on Xarelto. Developed acute onset of hematemesis that per patient and wife was massive and frankly bloody. Questionable prior history of intermittent melena as well. Reports minimal abdominal pain. No subjective fever or chills. In the emergency department the patient was having significant pain in her throat and neck. Lateral film suggested supraglottic edema confirmed by ENT with endoscopy. No infectious symptoms preceding her acute onset of nausea and vomiting. No known history of withdrawal with attempts at alcohol abstinence. Patient reports no recent long trips, history of clots, family history of clots/unexplained deaths, or periods of immobility that would explain the cause for her DVT. Patient has been on hormone replacement for approximately 4 years. Given patient's potential for airway compromise she was admitted to the ICU.  Subjective:  Swallowing is improved significant. No difficulty with secretions. No abdominal pain or nausea. No melena, hematochezia, or hematemesis since admission.  Review of Systems:  No chest pain or pressure. No subjective or chills.  Temp:  [98.2 F (36.8 C)-99.3 F (37.4 C)] 99.3 F (37.4 C) (11/20 1521) Pulse Rate:  [74-121] 95 (11/20 1400) Resp:  [14-26] 18 (11/20 1400) BP: (128-159)/(78-105) 153/98 (11/20 1400) SpO2:  [91 %-100 %] 98 % (11/20 1400) Weight:  [164 lb 7.4 oz (74.6 kg)] 164 lb 7.4 oz (74.6 kg) (11/19 1749)   General:  Wife at bedside. Awake. No distress. Integument:  Warm & dry. No rash on exposed skin. HEENT:  Moist mucus memebranes. No scleral icterus. Neurological:  Pupils symmetric. Oriented 4. Grossly nonfocal. Musculoskeletal:  No joint  effusion or erythema appreciated. Symmetric muscle bulk. Pulmonary:  Normal work of breathing on nasal cannula. Speaking in complete sentences. Good voice quality. Cardiovascular:  Regular rate. No appreciable JVD. No edema. Abdomen:  Soft. Nondistended. Normoactive bowel sounds. Nontender.  LINES/TUBES: PIV  CBC Latest Ref Rng & Units 04/21/2017 04/21/2017 04/20/2017  WBC 4.0 - 10.5 K/uL 16.0(H) 11.7(H) 10.5  Hemoglobin 12.0 - 15.0 g/dL 10.3(L) 10.0(L) 10.8(L)  Hematocrit 36.0 - 46.0 % 31.0(L) 29.5(L) 32.5(L)  Platelets 150 - 400 K/uL 284 270 301    BMP Latest Ref Rng & Units 04/21/2017 04/20/2017 04/13/2017  Glucose 65 - 99 mg/dL 115(H) 101(H) 79  BUN 6 - 20 mg/dL 5(L) 6 8  Creatinine 0.44 - 1.00 mg/dL 0.73 0.83 0.73  Sodium 135 - 145 mmol/L 135 138 137  Potassium 3.5 - 5.1 mmol/L 3.4(L) 3.3(L) 3.7  Chloride 101 - 111 mmol/L 105 103 108  CO2 22 - 32 mmol/L 20(L) 21(L) 20(L)  Calcium 8.9 - 10.3 mg/dL 7.7(L) 8.7(L) 9.1    Hepatic Function Latest Ref Rng & Units 04/21/2017 04/20/2017  Total Protein 6.5 - 8.1 g/dL 6.1(L) 7.2  Albumin 3.5 - 5.0 g/dL 3.3(L) 3.7  AST 15 - 41 U/L 43(H) 83(H)  ALT 14 - 54 U/L 25 29  Alk Phosphatase 38 - 126 U/L 52 66  Total Bilirubin 0.3 - 1.2 mg/dL 1.9(H) 1.8(H)    IMAGING/STUDIES: VENOUS DUPLEX 11/12:  There is extensive DVT throughout the right common femoral, femoral, and popliteal veins. It is partially occlusive in the common femoral vein but occlusive in the femoral and popliteal veins. NECK/SOFT TISSUE X-RAY 11/19:  Prominence of the epiglottis and aryepiglottic folds  with suggestion of mild focal narrowing of the subglottic tracheal airway. This may indicate epiglottitis or other inflammatory process. PORT CXR 11/19:  Personally reviewed by me. No focal opacity, mass, or pleural effusion appreciated. Mediastinal and heart condor's normal.  MICROBIOLOGY: MRSA PCR 11/19:  Negative   ANTIBIOTICS: Rocephin 11/19 - 11/20  SIGNIFICANT  EVENTS: 11/19 - Admit  ASSESSMENT/PLAN:  46 y.o. female with chronic history of alcohol use and recent diagnosis of right lower extremity DVT on treatment with systemic anticoagulation. Admitted with massive hematemesis and upper airway edema. Edema has improved clinically. Given patient's risk of rebleeding and also the extent of her DVT we will need to monitor the patient closely.   1. Massive hematemesis/upper GI bleed: Continuing Protonix IV every 12 hours. Consulting GI for upper endoscopy and further treatment recommendations. Trending CBC every 12 hours. 2. Acute right lower extremity DVT: Holding systemic and coagulation given upper GI bleeding. Consulting interventional radiology for IVC filter placement. Consider restarting systemic anticoagulation cautiously with heparin drip after EGD depending upon findings. 3. Upper airway edema: Improving clinically. Discontinuing Solu-Medrol. Likely secondary to recurrent emesis. Discontinuing Rocephin. 4. Chronic alcohol use: Continuing IV thiamine and folic acid. Ordering MedSurg CIWA protocol. 5. Asthma:  No signs of acute exacerbation. Duonebs BID continuing.  6. Hypokalemia: Mild. Monitor electrolytes closely. 7. Hypophosphatemia: Mild. Monitor electrolytes closely.  Prophylaxis:  Protonix IV q12hr.  Diet:  NPO. Code Status:  Full Code. Disposition:  Patient to remain in ICU given potential for re-bleeding & DVT. Family Update:  Patient and wife updated during rounds.  I have personally spent a total of 31 minutes of critical care time today caring for the patient, updating patient/wife at bedside, & reviewing the patient's electronic medical record.  Sonia Baller Ashok Cordia, M.D. [facility] Pager:  [phone] After 7pm or if no response, call [phone] 3:36 PM 04/21/17

## 2017-04-21 NOTE — H&P (View-Only) (Signed)
[facility] Gastroenterology Consult  Referring Provider: Dr.Nester  Primary Care Physician:  Maurice Small, MD Primary Gastroenterologist: None  Reason for Consultation:  Hematemesis  HPI: Ruth Charles is a 46 y.o. female vomiting blood. Patient was in her usual state of health until 2 days ago, Sunday evening, she developed multiple episodes of nausea and retching and vomiting. She had eaten at a restaurant and reports feeling nauseous and throwing up her previously ingested food, later vomiting fresh blood and blood clots. She was recently started on Xarelto for deep vein thrombosis of right leg last dose of Xarelto was Sunday evening. Patient has had history of acid reflux and regurgitation but no prior endoscopy. She denies difficulty swallowing or pain on swallowing. She denies in the change color of stool(denies black stool or bloody bowel movement). Denies unintentional weight loss, loss of appetite, early satiety or bloating. She was on Xeljanz until recently for psoriatic arthritis. She was on meloxicam until 1 month ago. Denies use of any other NSAIDs. Vomiting has stopped, further hematemesis.  Has not required blood transfusions. Plan is to place a retrieval IVC filter to avoid anticoagulation due to hematemesis but presence of recently diagnosed DVT. She presented to the ER Monday morning because of vomiting blood and also difficulty breathing with exacerbation of known asthma.   Past Medical History:  Diagnosis Date  . Arthritis   . Asthma   . Bronchitis    hx of   . Complication of anesthesia   . Cough   . Diarrhea   . Fatigue   . Frequent urination at night   . GERD (gastroesophageal reflux disease)   . Gluten intolerance   . Lactose intolerance   . Mumps    hx of   . Night sweats   . PONV (postoperative nausea and vomiting)   . Seasonal allergies   . Shortness of breath dyspnea   . Streptococcal infection    hx of   . Tinnitus   . Wheezing     Past  Surgical History:  Procedure Laterality Date  . APPENDECTOMY     2004  . BREAST SURGERY     breast biopsy left   . PATELLA-FEMORAL ARTHROPLASTY Left 11/06/2014   Procedure: LEFT KNEE PATELLA-FEMORAL ARTHROPLASTY;  Surgeon: Matthew Olin, MD;  Location: [facility];  Service: Orthopedics;  Laterality: Left;  . removal right ovary with cyst     2004  . right TMJ     20 02    Prior to Admission medications   Medication Sig Start Date End Date Taking? Authorizing Provider  albuterol (PROVENTIL HFA;VENTOLIN HFA) 108 (90 BASE) MCG/ACT inhaler Inhale 2 puffs into the lungs every 6 (six) hours as needed for wheezing or shortness of breath.    Yes [provider]  budesonide-formoterol (SYMBICORT) 160-4.5 MCG/ACT inhaler Inhale 2 puffs into the lungs 2 (two) times daily.   Yes [provider]  cetirizine (ZYRTEC) 10 MG tablet Take 10 mg by mouth daily.   Yes [provider]  diphenhydrAMINE (BENADRYL) 25 MG tablet Take 25 mg every 6 (six) hours as needed by mouth (for "back up" if Zyrtec is not effective).    Yes [provider]  furosemide (LASIX) 40 MG tablet Take 40 mg daily as needed by mouth (for obvious swelling of the ankles).   Yes [provider]  norethindrone-ethinyl estradiol-iron (JUNEL FE 1.5/30) 1.5-30 MG-MCG tablet Take 1 tablet See admin instructions by mouth. 1 tablet once a day for three consecutive weeks  then OFF for one week   Yes [provider]  omeprazole (PRILOSEC) 20 MG capsule Take 1 capsule (20 mg total) by mouth daily. 03/11/16  Yes Muthersbaugh, Boyd Kerbs  Rivaroxaban 15 & 20 MG TBPK Take 15 mg 2 (two) times daily by mouth. Take as directed on package: Start with one 15mg  tablet by mouth twice a day with food. On Day 22, switch to one 20mg  tablet once a day with food. Patient taking differently: Take 15 mg 2 (two) times daily by mouth. Take as directed on package: Start with one 15mg  tablet by mouth twice a day with food. On  Day 22, switch to one 20mg  tablet once a day with food.  ( on day 6 of therapy) 04/13/17  Yes Lamont Snowball, MD  traMADol (ULTRAM) 50 MG tablet Take 50 mg every 8 (eight) hours as needed by mouth (for pain).    Yes [provider]  ALPRAZolam (XANAX) 0.25 MG tablet Take 0.25 mg by mouth 3 (three) times daily as needed for anxiety.    [provider]  docusate sodium (COLACE) 100 MG capsule Take 1 capsule (100 mg total) by mouth 2 (two) times daily. Patient not taking: Reported on 04/13/2017 11/07/14   Lanney Gins, PA-C  ferrous sulfate 325 (65 FE) MG tablet Take 1 tablet (325 mg total) by mouth 3 (three) times daily after meals. Patient not taking: Reported on 04/13/2017 11/07/14   Lanney Gins, PA-C  HYDROcodone-acetaminophen (NORCO) 7.5-325 MG per tablet Take 1-2 tablets by mouth every 4 (four) hours as needed for moderate pain. Patient not taking: Reported on 04/13/2017 11/07/14   Lanney Gins, PA-C  methocarbamol (ROBAXIN) 500 MG tablet Take 1 tablet (500 mg total) by mouth every 6 (six) hours as needed for muscle spasms. Patient not taking: Reported on 04/13/2017 11/07/14   Lanney Gins, PA-C  polyethylene glycol (MIRALAX / Ethelene Hal) packet Take 17 g by mouth 2 (two) times daily. Patient not taking: Reported on 04/13/2017 11/07/14   Lanney Gins, PA-C  predniSONE (DELTASONE) 20 MG tablet Take 2 tablets (40 mg total) by mouth daily. Patient not taking: Reported on 04/13/2017 03/11/16   Muthersbaugh, Dahlia Client, PA-C    Current Facility-Administered Medications  Medication Dose Route Frequency Provider Last Rate Last Dose  . 0.9 %  sodium chloride infusion  250 mL Intravenous PRN Lynnell Jude, MD      . 0.9 %  sodium chloride infusion   Intravenous Continuous Roslynn Amble, MD 10 mL/hr at 04/21/17 1259    . albuterol (PROVENTIL) (2.5 MG/3ML) 0.083% nebulizer solution 2.5 mg  2.5 mg Nebulization Q4H PRN Lynnell Jude, MD      . B-complex with vitamin C tablet 1  tablet  1 tablet Oral Daily Lynnell Jude, MD   1 tablet at 04/21/17 0912  . folic acid injection 1 mg  1 mg Intravenous Daily Roslynn Amble, MD   1 mg at 04/21/17 1300  . hydrALAZINE (APRESOLINE) injection 5 mg  5 mg Intravenous Q4H PRN Lynnell Jude, MD      . insulin aspart (novoLOG) injection 0-9 Units  0-9 Units Subcutaneous Q4H Roslynn Amble, MD   1 Units at 04/21/17 1259  . ipratropium-albuterol (DUONEB) 0.5-2.5 (3) MG/3ML nebulizer solution 3 mL  3 mL Nebulization BID Lynnell Jude, MD   3 mL at 04/21/17 0818  . LORazepam (ATIVAN) tablet 1 mg  1 mg Oral Q6H PRN Roslynn Amble, MD  Or  . LORazepam (ATIVAN) injection 1 mg  1 mg Intravenous Q6H PRN Roslynn AmbleNestor, Jennings E, MD      . magnesium sulfate IVPB 2 g 50 mL  2 g Intravenous Once Karl ItoSommer, Steven E, MD      . ondansetron [facility](ZOFRAN) injection 4 mg  4 mg Intravenous Q6H PRN Lynnell JudeGray, Walter J, MD      . pantoprazole (PROTONIX) injection 40 mg  40 mg Intravenous Q12H Lynnell JudeGray, Walter J, MD   40 mg at 04/21/17 [id number]  . sodium glycerophosphate (GLYCOPHOS) 20 mmol in sodium chloride 0.9 % 250 mL infusion  20 mmol Intravenous Once Karl ItoSommer, Steven E, MD 34 mL/hr at 04/21/17 0846 20 mmol at 04/21/17 0846  . thiamine (B-1) injection 100 mg  100 mg Intravenous Daily Lynnell JudeGray, Walter J, MD   100 mg at 04/21/17 [id number]    Allergies as of 04/20/2017 - Review Complete 04/20/2017  Allergen Reaction Noted  . Corn-containing products Other (See Comments) 11/02/2014  . Gluten meal Other (See Comments) 11/02/2014  . Lactose intolerance (gi) Other (See Comments) 11/02/2014  . Remicade [infliximab] Shortness Of Breath and Other (See Comments) 04/13/2017  . Cosentyx [secukinumab] Other (See Comments) 04/13/2017  . Enbrel [etanercept] Other (See Comments) 04/13/2017  . Humira [adalimumab] Other (See Comments) 04/13/2017  . Sulfa antibiotics Hives 10/26/2014  . Tape Other (See Comments) 04/13/2017  . Latex Rash 04/13/2017    History reviewed. No  pertinent family history.  Social History   Socioeconomic History  . Marital status: Married    Spouse name: Not on file  . Number of children: Not on file  . Years of education: Not on file  . Highest education level: Not on file  Social Needs  . Financial resource strain: Not on file  . Food insecurity - worry: Not on file  . Food insecurity - inability: Not on file  . Transportation needs - medical: Not on file  . Transportation needs - non-medical: Not on file  Occupational History  . Not on file  Tobacco Use  . Smoking status: Never Smoker  . Smokeless tobacco: Never Used  Substance and Sexual Activity  . Alcohol use: Yes    Comment: wine and liquor 8-14 glasses per week   . Drug use: No  . Sexual activity: Not on file  Other Topics Concern  . Not on file  Social History Narrative  . Not on file    Review of Systems: Positive for: GI: Described in detail in HPI.    Gen: Denies any fever, chills, rigors, night sweats, anorexia, fatigue, weakness, malaise, involuntary weight loss, and sleep disorder CV: Denies chest pain, angina, palpitations, syncope, orthopnea, PND, peripheral edema, and claudication. Resp: dyspnea, cough,Denies  sputum, wheezing, coughing up blood. GU : Denies urinary burning, blood in urine, urinary frequency, urinary hesitancy, nocturnal urination, and urinary incontinence. MS: joint pain, Denies  swelling.  Denies muscle weakness, cramps, atrophy.  Derm: Denies rash, itching, oral ulcerations, hives, unhealing ulcers.  Psych: Denies depression, anxiety, memory loss, suicidal ideation, hallucinations,  and confusion. Heme: bleeding,Denies bruising  and enlarged lymph nodes. Neuro:  Denies any headaches, dizziness, paresthesias. Endo:  Denies any problems with DM, thyroid, adrenal function.  Physical Exam: Vital signs in last 24 hours: Temp:  [98.2 F (36.8 C)-98.7 F (37.1 C)] 98.5 F (36.9 C) (11/20 1218) Pulse Rate:  [74-128] 95 (11/20  1400) Resp:  [14-26] 18 (11/20 1400) BP: (128-159)/(77-105) 153/98 (11/20 1400) SpO2:  [91 %-100 %] 98 % (11/20  1400) Weight:  [74.6 kg (164 lb 7.4 oz)] 74.6 kg (164 lb 7.4 oz) (11/19 1749) Last BM Date: 04/21/17  General:   Alert,  Well-developed, well-nourished, pleasant and cooperative in NAD Head:  Normocephalic and atraumatic. Eyes:  Sclera clear, no icterus.   Mild pallor Ears:  Normal auditory acuity. Nose:  No deformity, discharge,  or lesions. Mouth:  No deformity or lesions.  Oropharynx pink & moist. Neck:  Supple; no masses or thyromegaly. Lungs:  Clear throughout to auscultation.   No wheezes, crackles, or rhonchi. No acute distress. Heart:  Regular rate and rhythm; no murmurs, clicks, rubs,  or gallops.  Extremities:  Without clubbing or edema. Neurologic:  Alert and  oriented x4;  grossly normal neurologically. Skin:  Intact without significant lesions or rashes. Psych:  Alert and cooperative. Normal mood and affect. Abdomen:  Soft, nontender and nondistended. No masses, hepatosplenomegaly or hernias noted. Normal bowel sounds, without guarding, and without rebound.         Lab Results: Recent Labs    04/20/17 1859 04/21/17 0042 04/21/17 0748  WBC 10.5 11.7* 16.0*  HGB 10.8* 10.0* 10.3*  HCT 32.5* 29.5* 31.0*  PLT 301 270 284   BMET Recent Labs    04/20/17 0515 04/21/17 0514  NA 138 135  K 3.3* 3.4*  CL 103 105  CO2 21* 20*  GLUCOSE 101* 115*  BUN 6 5*  CREATININE 0.83 0.73  CALCIUM 8.7* 7.7*   LFT Recent Labs    04/21/17 0514  PROT 6.1*  ALBUMIN 3.3*  AST 43*  ALT 25  ALKPHOS 52  BILITOT 1.9*   PT/INR Recent Labs    04/20/17 0515  LABPROT 19.7*  INR 1.69    Studies/Results: Dg Neck Soft Tissue  Result Date: 04/20/2017 CLINICAL DATA:  Sore throat and shortness of breath tonight. Vomiting and neck swelling. EXAM: NECK SOFT TISSUES - 1+ VIEW COMPARISON:  CT sinuses 05/06/2010 FINDINGS: Suggestion of mild focal narrowing of the  subglottic tracheal airway with prominence of the epiglottis and aryepiglottic folds. This could represent epiglottitis or other inflammatory process. No prevertebral or submental soft tissue swelling. No radiopaque foreign bodies. Degenerative changes in the cervical spine. IMPRESSION: Prominence of the epiglottis and aryepiglottic folds with suggestion of mild focal narrowing of the subglottic tracheal airway. This may indicate epiglottitis or other inflammatory process. Electronically Signed   By: Burman Nieves M.D.   On: 04/20/2017 06:03   Dg Chest Port 1 View  Result Date: 04/20/2017 CLINICAL DATA:  Shortness of breath. Sore throat with swelling. Vomiting. EXAM: PORTABLE CHEST 1 VIEW COMPARISON:  03/11/2016 FINDINGS: Shallow inspiration. Normal heart size and pulmonary vascularity. No focal airspace disease or consolidation in the lungs. No blunting of costophrenic angles. No pneumothorax. Mediastinal contours appear intact. IMPRESSION: No active disease. Electronically Signed   By: Burman Nieves M.D.   On: 04/20/2017 06:12    Impression: 1. Hematemesis 2. Last dose of Xarelto Sunday evening 3. Macrocytic anemia, Hb 10.3, MCV 116.1, platelet 284, normal BUN/creatinine ratio 4. Daily alcohol use 5. Extensive deep vein thrombosis(right common femoral, femoral, and popliteal veins, partially occlusive in the common femoral vein but occlusive in the femoral and popliteal veins.)  Plan: Diagnostic EGD in a.m. Nothing by mouth post midnight. Differential diagnosis includes: Mallory-Weiss tear, peptic ulcer disease, esophagitis, less likely esophageal varices. On Protonix 40 mg every 12 hours. Hemodynamically stable, monitor H&H and transfuse if hemoglobin less than 7. On thiamine IV, folic acid IV and B complex  tablet   LOS: 1 day   Kerin Salen, M.D.  04/21/2017, 2:28 PM  Pager [phone] If no answer or after 5 PM call [phone]

## 2017-04-21 NOTE — Consult Note (Signed)
[facility] Gastroenterology Consult  Referring Provider: Dr.Nester  Primary Care Physician:  Maurice Small, MD Primary Gastroenterologist: None  Reason for Consultation:  Hematemesis  HPI: Ruth Charles is a 46 y.o. female vomiting blood. Patient was in her usual state of health until 2 days ago, Sunday evening, she developed multiple episodes of nausea and retching and vomiting. She had eaten at a restaurant and reports feeling nauseous and throwing up her previously ingested food, later vomiting fresh blood and blood clots. She was recently started on Xarelto for deep vein thrombosis of right leg last dose of Xarelto was Sunday evening. Patient has had history of acid reflux and regurgitation but no prior endoscopy. She denies difficulty swallowing or pain on swallowing. She denies in the change color of stool(denies black stool or bloody bowel movement). Denies unintentional weight loss, loss of appetite, early satiety or bloating. She was on Xeljanz until recently for psoriatic arthritis. She was on meloxicam until 1 month ago. Denies use of any other NSAIDs. Vomiting has stopped, further hematemesis.  Has not required blood transfusions. Plan is to place a retrieval IVC filter to avoid anticoagulation due to hematemesis but presence of recently diagnosed DVT. She presented to the ER Monday morning because of vomiting blood and also difficulty breathing with exacerbation of known asthma.   Past Medical History:  Diagnosis Date  . Arthritis   . Asthma   . Bronchitis    hx of   . Complication of anesthesia   . Cough   . Diarrhea   . Fatigue   . Frequent urination at night   . GERD (gastroesophageal reflux disease)   . Gluten intolerance   . Lactose intolerance   . Mumps    hx of   . Night sweats   . PONV (postoperative nausea and vomiting)   . Seasonal allergies   . Shortness of breath dyspnea   . Streptococcal infection    hx of   . Tinnitus   . Wheezing     Past  Surgical History:  Procedure Laterality Date  . APPENDECTOMY     2004  . BREAST SURGERY     breast biopsy left   . PATELLA-FEMORAL ARTHROPLASTY Left 11/06/2014   Procedure: LEFT KNEE PATELLA-FEMORAL ARTHROPLASTY;  Surgeon: Matthew Olin, MD;  Location: [facility] ORS;  Service: Orthopedics;  Laterality: Left;  . removal right ovary with cyst     2004  . right TMJ     20 02    Prior to Admission medications   Medication Sig Start Date End Date Taking? Authorizing Provider  albuterol (PROVENTIL HFA;VENTOLIN HFA) 108 (90 BASE) MCG/ACT inhaler Inhale 2 puffs into the lungs every 6 (six) hours as needed for wheezing or shortness of breath.    Yes [provider]  budesonide-formoterol (SYMBICORT) 160-4.5 MCG/ACT inhaler Inhale 2 puffs into the lungs 2 (two) times daily.   Yes [provider]  cetirizine (ZYRTEC) 10 MG tablet Take 10 mg by mouth daily.   Yes [provider]  diphenhydrAMINE (BENADRYL) 25 MG tablet Take 25 mg every 6 (six) hours as needed by mouth (for "back up" if Zyrtec is not effective).    Yes [provider]  furosemide (LASIX) 40 MG tablet Take 40 mg daily as needed by mouth (for obvious swelling of the ankles).   Yes [provider]  norethindrone-ethinyl estradiol-iron (JUNEL FE 1.5/30) 1.5-30 MG-MCG tablet Take 1 tablet See admin instructions by mouth. 1 tablet once a day for three consecutive weeks  then OFF for one week   Yes [provider]  omeprazole (PRILOSEC) 20 MG capsule Take 1 capsule (20 mg total) by mouth daily. 03/11/16  Yes Muthersbaugh, Boyd Kerbs  Rivaroxaban 15 & 20 MG TBPK Take 15 mg 2 (two) times daily by mouth. Take as directed on package: Start with one 15mg  tablet by mouth twice a day with food. On Day 22, switch to one 20mg  tablet once a day with food. Patient taking differently: Take 15 mg 2 (two) times daily by mouth. Take as directed on package: Start with one 15mg  tablet by mouth twice a day with food. On  Day 22, switch to one 20mg  tablet once a day with food.  ( on day 6 of therapy) 04/13/17  Yes Lamont Snowball, MD  traMADol (ULTRAM) 50 MG tablet Take 50 mg every 8 (eight) hours as needed by mouth (for pain).    Yes [provider]  ALPRAZolam (XANAX) 0.25 MG tablet Take 0.25 mg by mouth 3 (three) times daily as needed for anxiety.    [provider]  docusate sodium (COLACE) 100 MG capsule Take 1 capsule (100 mg total) by mouth 2 (two) times daily. Patient not taking: Reported on 04/13/2017 11/07/14   Lanney Gins, PA-C  ferrous sulfate 325 (65 FE) MG tablet Take 1 tablet (325 mg total) by mouth 3 (three) times daily after meals. Patient not taking: Reported on 04/13/2017 11/07/14   Lanney Gins, PA-C  HYDROcodone-acetaminophen (NORCO) 7.5-325 MG per tablet Take 1-2 tablets by mouth every 4 (four) hours as needed for moderate pain. Patient not taking: Reported on 04/13/2017 11/07/14   Lanney Gins, PA-C  methocarbamol (ROBAXIN) 500 MG tablet Take 1 tablet (500 mg total) by mouth every 6 (six) hours as needed for muscle spasms. Patient not taking: Reported on 04/13/2017 11/07/14   Lanney Gins, PA-C  polyethylene glycol (MIRALAX / Ethelene Hal) packet Take 17 g by mouth 2 (two) times daily. Patient not taking: Reported on 04/13/2017 11/07/14   Lanney Gins, PA-C  predniSONE (DELTASONE) 20 MG tablet Take 2 tablets (40 mg total) by mouth daily. Patient not taking: Reported on 04/13/2017 03/11/16   Muthersbaugh, Dahlia Client, PA-C    Current Facility-Administered Medications  Medication Dose Route Frequency Provider Last Rate Last Dose  . 0.9 %  sodium chloride infusion  250 mL Intravenous PRN Lynnell Jude, MD      . 0.9 %  sodium chloride infusion   Intravenous Continuous Roslynn Amble, MD 10 mL/hr at 04/21/17 1259    . albuterol (PROVENTIL) (2.5 MG/3ML) 0.083% nebulizer solution 2.5 mg  2.5 mg Nebulization Q4H PRN Lynnell Jude, MD      . B-complex with vitamin C tablet 1  tablet  1 tablet Oral Daily Lynnell Jude, MD   1 tablet at 04/21/17 0912  . folic acid injection 1 mg  1 mg Intravenous Daily Roslynn Amble, MD   1 mg at 04/21/17 1300  . hydrALAZINE (APRESOLINE) injection 5 mg  5 mg Intravenous Q4H PRN Lynnell Jude, MD      . insulin aspart (novoLOG) injection 0-9 Units  0-9 Units Subcutaneous Q4H Roslynn Amble, MD   1 Units at 04/21/17 1259  . ipratropium-albuterol (DUONEB) 0.5-2.5 (3) MG/3ML nebulizer solution 3 mL  3 mL Nebulization BID Lynnell Jude, MD   3 mL at 04/21/17 0818  . LORazepam (ATIVAN) tablet 1 mg  1 mg Oral Q6H PRN Roslynn Amble, MD  Or  . LORazepam (ATIVAN) injection 1 mg  1 mg Intravenous Q6H PRN Roslynn AmbleNestor, Jennings E, MD      . magnesium sulfate IVPB 2 g 50 mL  2 g Intravenous Once Karl ItoSommer, Steven E, MD      . ondansetron [facility](ZOFRAN) injection 4 mg  4 mg Intravenous Q6H PRN Lynnell JudeGray, Walter J, MD      . pantoprazole (PROTONIX) injection 40 mg  40 mg Intravenous Q12H Lynnell JudeGray, Walter J, MD   40 mg at 04/21/17 [id number]  . sodium glycerophosphate (GLYCOPHOS) 20 mmol in sodium chloride 0.9 % 250 mL infusion  20 mmol Intravenous Once Karl ItoSommer, Steven E, MD 34 mL/hr at 04/21/17 0846 20 mmol at 04/21/17 0846  . thiamine (B-1) injection 100 mg  100 mg Intravenous Daily Lynnell JudeGray, Walter J, MD   100 mg at 04/21/17 [id number]    Allergies as of 04/20/2017 - Review Complete 04/20/2017  Allergen Reaction Noted  . Corn-containing products Other (See Comments) 11/02/2014  . Gluten meal Other (See Comments) 11/02/2014  . Lactose intolerance (gi) Other (See Comments) 11/02/2014  . Remicade [infliximab] Shortness Of Breath and Other (See Comments) 04/13/2017  . Cosentyx [secukinumab] Other (See Comments) 04/13/2017  . Enbrel [etanercept] Other (See Comments) 04/13/2017  . Humira [adalimumab] Other (See Comments) 04/13/2017  . Sulfa antibiotics Hives 10/26/2014  . Tape Other (See Comments) 04/13/2017  . Latex Rash 04/13/2017    History reviewed. No  pertinent family history.  Social History   Socioeconomic History  . Marital status: Married    Spouse name: Not on file  . Number of children: Not on file  . Years of education: Not on file  . Highest education level: Not on file  Social Needs  . Financial resource strain: Not on file  . Food insecurity - worry: Not on file  . Food insecurity - inability: Not on file  . Transportation needs - medical: Not on file  . Transportation needs - non-medical: Not on file  Occupational History  . Not on file  Tobacco Use  . Smoking status: Never Smoker  . Smokeless tobacco: Never Used  Substance and Sexual Activity  . Alcohol use: Yes    Comment: wine and liquor 8-14 glasses per week   . Drug use: No  . Sexual activity: Not on file  Other Topics Concern  . Not on file  Social History Narrative  . Not on file    Review of Systems: Positive for: GI: Described in detail in HPI.    Gen: Denies any fever, chills, rigors, night sweats, anorexia, fatigue, weakness, malaise, involuntary weight loss, and sleep disorder CV: Denies chest pain, angina, palpitations, syncope, orthopnea, PND, peripheral edema, and claudication. Resp: dyspnea, cough,Denies  sputum, wheezing, coughing up blood. GU : Denies urinary burning, blood in urine, urinary frequency, urinary hesitancy, nocturnal urination, and urinary incontinence. MS: joint pain, Denies  swelling.  Denies muscle weakness, cramps, atrophy.  Derm: Denies rash, itching, oral ulcerations, hives, unhealing ulcers.  Psych: Denies depression, anxiety, memory loss, suicidal ideation, hallucinations,  and confusion. Heme: bleeding,Denies bruising  and enlarged lymph nodes. Neuro:  Denies any headaches, dizziness, paresthesias. Endo:  Denies any problems with DM, thyroid, adrenal function.  Physical Exam: Vital signs in last 24 hours: Temp:  [98.2 F (36.8 C)-98.7 F (37.1 C)] 98.5 F (36.9 C) (11/20 1218) Pulse Rate:  [74-128] 95 (11/20  1400) Resp:  [14-26] 18 (11/20 1400) BP: (128-159)/(77-105) 153/98 (11/20 1400) SpO2:  [91 %-100 %] 98 % (11/20  1400) Weight:  [74.6 kg (164 lb 7.4 oz)] 74.6 kg (164 lb 7.4 oz) (11/19 1749) Last BM Date: 04/21/17  General:   Alert,  Well-developed, well-nourished, pleasant and cooperative in NAD Head:  Normocephalic and atraumatic. Eyes:  Sclera clear, no icterus.   Mild pallor Ears:  Normal auditory acuity. Nose:  No deformity, discharge,  or lesions. Mouth:  No deformity or lesions.  Oropharynx pink & moist. Neck:  Supple; no masses or thyromegaly. Lungs:  Clear throughout to auscultation.   No wheezes, crackles, or rhonchi. No acute distress. Heart:  Regular rate and rhythm; no murmurs, clicks, rubs,  or gallops.  Extremities:  Without clubbing or edema. Neurologic:  Alert and  oriented x4;  grossly normal neurologically. Skin:  Intact without significant lesions or rashes. Psych:  Alert and cooperative. Normal mood and affect. Abdomen:  Soft, nontender and nondistended. No masses, hepatosplenomegaly or hernias noted. Normal bowel sounds, without guarding, and without rebound.         Lab Results: Recent Labs    04/20/17 1859 04/21/17 0042 04/21/17 0748  WBC 10.5 11.7* 16.0*  HGB 10.8* 10.0* 10.3*  HCT 32.5* 29.5* 31.0*  PLT 301 270 284   BMET Recent Labs    04/20/17 0515 04/21/17 0514  NA 138 135  K 3.3* 3.4*  CL 103 105  CO2 21* 20*  GLUCOSE 101* 115*  BUN 6 5*  CREATININE 0.83 0.73  CALCIUM 8.7* 7.7*   LFT Recent Labs    04/21/17 0514  PROT 6.1*  ALBUMIN 3.3*  AST 43*  ALT 25  ALKPHOS 52  BILITOT 1.9*   PT/INR Recent Labs    04/20/17 0515  LABPROT 19.7*  INR 1.69    Studies/Results: Dg Neck Soft Tissue  Result Date: 04/20/2017 CLINICAL DATA:  Sore throat and shortness of breath tonight. Vomiting and neck swelling. EXAM: NECK SOFT TISSUES - 1+ VIEW COMPARISON:  CT sinuses 05/06/2010 FINDINGS: Suggestion of mild focal narrowing of the  subglottic tracheal airway with prominence of the epiglottis and aryepiglottic folds. This could represent epiglottitis or other inflammatory process. No prevertebral or submental soft tissue swelling. No radiopaque foreign bodies. Degenerative changes in the cervical spine. IMPRESSION: Prominence of the epiglottis and aryepiglottic folds with suggestion of mild focal narrowing of the subglottic tracheal airway. This may indicate epiglottitis or other inflammatory process. Electronically Signed   By: Burman Nieves M.D.   On: 04/20/2017 06:03   Dg Chest Port 1 View  Result Date: 04/20/2017 CLINICAL DATA:  Shortness of breath. Sore throat with swelling. Vomiting. EXAM: PORTABLE CHEST 1 VIEW COMPARISON:  03/11/2016 FINDINGS: Shallow inspiration. Normal heart size and pulmonary vascularity. No focal airspace disease or consolidation in the lungs. No blunting of costophrenic angles. No pneumothorax. Mediastinal contours appear intact. IMPRESSION: No active disease. Electronically Signed   By: Burman Nieves M.D.   On: 04/20/2017 06:12    Impression: 1. Hematemesis 2. Last dose of Xarelto Sunday evening 3. Macrocytic anemia, Hb 10.3, MCV 116.1, platelet 284, normal BUN/creatinine ratio 4. Daily alcohol use 5. Extensive deep vein thrombosis(right common femoral, femoral, and popliteal veins, partially occlusive in the common femoral vein but occlusive in the femoral and popliteal veins.)  Plan: Diagnostic EGD in a.m. Nothing by mouth post midnight. Differential diagnosis includes: Mallory-Weiss tear, peptic ulcer disease, esophagitis, less likely esophageal varices. On Protonix 40 mg every 12 hours. Hemodynamically stable, monitor H&H and transfuse if hemoglobin less than 7. On thiamine IV, folic acid IV and B complex  tablet   LOS: 1 day   Kerin Salen, M.D.  04/21/2017, 2:28 PM  Pager [phone] If no answer or after 5 PM call [phone]

## 2017-04-21 NOTE — Progress Notes (Signed)
eLink Physician-Brief Progress Note Patient Name: Ruth Charles DOB: [birth year] MRN: [id number]   Date of Service  04/21/2017  HPI/Events of Note  K+ = 3.4, PO4--- = 1.9, Mg++ = 1.7 and Creatinine = 0.73.  eICU Interventions  Will order: 1. Replace K+, Mg++ and PO4---.     Intervention Category Major Interventions: Electrolyte abnormality - evaluation and management  [name] 04/21/2017, 6:28 AM

## 2017-04-22 ENCOUNTER — Encounter (HOSPITAL_COMMUNITY): Payer: Self-pay | Admitting: Interventional Radiology

## 2017-04-22 ENCOUNTER — Inpatient Hospital Stay (HOSPITAL_COMMUNITY): Payer: Managed Care, Other (non HMO)

## 2017-04-22 ENCOUNTER — Inpatient Hospital Stay (HOSPITAL_COMMUNITY): Payer: Managed Care, Other (non HMO) | Admitting: Anesthesiology

## 2017-04-22 ENCOUNTER — Encounter (HOSPITAL_COMMUNITY)
Admission: EM | Disposition: A | Discharge status: Home / Self Care | Payer: Self-pay | Source: Home / Self Care | Attending: Pulmonary Disease

## 2017-04-22 HISTORY — PX: IR IVC FILTER PLMT / S&I /IMG GUID/MOD SED: IMG701

## 2017-04-22 HISTORY — PX: ESOPHAGOGASTRODUODENOSCOPY: SHX5428

## 2017-04-22 LAB — CBC WITH DIFFERENTIAL/PLATELET
BASOS ABS: 0 10*3/uL (ref 0.0–0.1)
BASOS PCT: 0 %
Basophils Absolute: 0 10*3/uL (ref 0.0–0.1)
Basophils Relative: 0 %
EOS ABS: 0 10*3/uL (ref 0.0–0.7)
EOS PCT: 0 %
Eosinophils Absolute: 0 10*3/uL (ref 0.0–0.7)
Eosinophils Relative: 0 %
HCT: 31.4 % — ABNORMAL LOW (ref 36.0–46.0)
HEMATOCRIT: 34.5 % — AB (ref 36.0–46.0)
Hemoglobin: 10.4 g/dL — ABNORMAL LOW (ref 12.0–15.0)
Hemoglobin: 11.7 g/dL — ABNORMAL LOW (ref 12.0–15.0)
LYMPHS PCT: 12 %
Lymphocytes Relative: 2 %
Lymphs Abs: 1.3 10*3/uL (ref 0.7–4.0)
Lymphs Abs: 0.2 10*3/uL — ABNORMAL LOW (ref 0.7–4.0)
MCH: 39 pg — ABNORMAL HIGH (ref 26.0–34.0)
MCH: 39.7 pg — AB (ref 26.0–34.0)
MCHC: 33.1 g/dL (ref 30.0–36.0)
MCHC: 33.9 g/dL (ref 30.0–36.0)
MCV: 117.6 fL — ABNORMAL HIGH (ref 78.0–100.0)
MCV: 116.9 fL — AB (ref 78.0–100.0)
MONO ABS: 0.6 10*3/uL (ref 0.1–1.0)
MONO ABS: 0.1 10*3/uL (ref 0.1–1.0)
Monocytes Relative: 6 %
Monocytes Relative: 1 %
NEUTROS ABS: 10.2 10*3/uL — AB (ref 1.7–7.7)
NEUTROS PCT: 82 %
NEUTROS PCT: 97 %
Neutro Abs: 8.8 10*3/uL — ABNORMAL HIGH (ref 1.7–7.7)
PLATELETS: 258 10*3/uL (ref 150–400)
PLATELETS: 226 10*3/uL (ref 150–400)
RBC: 2.67 MIL/uL — AB (ref 3.87–5.11)
RBC: 2.95 MIL/uL — ABNORMAL LOW (ref 3.87–5.11)
RDW: 16.7 % — ABNORMAL HIGH (ref 11.5–15.5)
RDW: 16.3 % — AB (ref 11.5–15.5)
WBC: 10.7 10*3/uL — AB (ref 4.0–10.5)
WBC: 10.5 10*3/uL (ref 4.0–10.5)

## 2017-04-22 LAB — MAGNESIUM: MAGNESIUM: 1.8 mg/dL (ref 1.7–2.4)

## 2017-04-22 LAB — GLUCOSE, CAPILLARY
GLUCOSE-CAPILLARY: 79 mg/dL (ref 65–99)
GLUCOSE-CAPILLARY: 85 mg/dL (ref 65–99)
Glucose-Capillary: 148 mg/dL — ABNORMAL HIGH (ref 65–99)

## 2017-04-22 LAB — RENAL FUNCTION PANEL
ALBUMIN: 3 g/dL — AB (ref 3.5–5.0)
ANION GAP: 5 (ref 5–15)
BUN: 6 mg/dL (ref 6–20)
CALCIUM: 7.6 mg/dL — AB (ref 8.9–10.3)
CO2: 20 mmol/L — AB (ref 22–32)
CREATININE: 0.64 mg/dL (ref 0.44–1.00)
Chloride: 110 mmol/L (ref 101–111)
Glucose, Bld: 68 mg/dL (ref 65–99)
PHOSPHORUS: 1.4 mg/dL — AB (ref 2.5–4.6)
Potassium: 2.9 mmol/L — ABNORMAL LOW (ref 3.5–5.1)
SODIUM: 135 mmol/L (ref 135–145)

## 2017-04-22 LAB — APTT: aPTT: 24 seconds (ref 24–36)

## 2017-04-22 LAB — PROTIME-INR
INR: 1.1
PROTHROMBIN TIME: 14.1 s (ref 11.4–15.2)

## 2017-04-22 SURGERY — EGD (ESOPHAGOGASTRODUODENOSCOPY)
Anesthesia: Monitor Anesthesia Care

## 2017-04-22 MED ORDER — MIDAZOLAM HCL 2 MG/2ML IJ SOLN
INTRAMUSCULAR | Status: AC | PRN
  Administered 2017-04-22: 0.5 mg via INTRAVENOUS
  Administered 2017-04-22: 1 mg via INTRAVENOUS

## 2017-04-22 MED ORDER — MOMETASONE FURO-FORMOTEROL FUM 200-5 MCG/ACT IN AERO
2.0000 | INHALATION_SPRAY | Freq: Two times a day (BID) | RESPIRATORY_TRACT | Status: DC
  Administered 2017-04-22 – 2017-04-23 (×3): 2 via RESPIRATORY_TRACT
  Filled 2017-04-22 (×2): qty 8.8

## 2017-04-22 MED ORDER — ONDANSETRON HCL 4 MG/2ML IJ SOLN: 4.0000 mg | Freq: Once | INTRAMUSCULAR | Status: DC | PRN

## 2017-04-22 MED ORDER — DIPHENHYDRAMINE HCL 25 MG PO TABS
25.0000 mg | ORAL_TABLET | Freq: Four times a day (QID) | ORAL | Status: DC | PRN
  Filled 2017-04-22: qty 1

## 2017-04-22 MED ORDER — PROPOFOL 500 MG/50ML IV EMUL
INTRAVENOUS | Status: DC | PRN
  Administered 2017-04-22: 100 ug/kg/min via INTRAVENOUS

## 2017-04-22 MED ORDER — RIVAROXABAN (XARELTO) VTE STARTER PACK (15 & 20 MG): 15.0000 mg | ORAL_TABLET | Freq: Two times a day (BID) | ORAL | Status: DC

## 2017-04-22 MED ORDER — PANTOPRAZOLE SODIUM 40 MG PO TBEC
40.0000 mg | DELAYED_RELEASE_TABLET | Freq: Two times a day (BID) | ORAL | Status: DC
  Administered 2017-04-22 – 2017-04-24 (×4): 40 mg via ORAL
  Filled 2017-04-22 (×4): qty 1

## 2017-04-22 MED ORDER — POTASSIUM CHLORIDE CRYS ER 20 MEQ PO TBCR: 80.0000 meq | EXTENDED_RELEASE_TABLET | Freq: Once | ORAL | Status: DC

## 2017-04-22 MED ORDER — TRAMADOL HCL 50 MG PO TABS: 50.0000 mg | ORAL_TABLET | Freq: Three times a day (TID) | ORAL | Status: DC | PRN

## 2017-04-22 MED ORDER — POTASSIUM PHOSPHATES 15 MMOLE/5ML IV SOLN
30.0000 mmol | Freq: Once | INTRAVENOUS | Status: AC
  Administered 2017-04-22: 30 mmol via INTRAVENOUS
  Filled 2017-04-22 (×2): qty 10

## 2017-04-22 MED ORDER — DEXAMETHASONE SODIUM PHOSPHATE 10 MG/ML IJ SOLN
INTRAMUSCULAR | Status: DC | PRN
  Administered 2017-04-22: 10 mg via INTRAVENOUS

## 2017-04-22 MED ORDER — PROPOFOL 10 MG/ML IV BOLUS
INTRAVENOUS | Status: DC | PRN
  Administered 2017-04-22: 30 mg via INTRAVENOUS
  Administered 2017-04-22: 20 mg via INTRAVENOUS
  Administered 2017-04-22: 30 mg via INTRAVENOUS

## 2017-04-22 MED ORDER — LIDOCAINE HCL 1 % IJ SOLN
INTRAMUSCULAR | Status: AC | PRN
Start: 2017-04-22 — End: 2017-04-22
  Administered 2017-04-22: 10 mL

## 2017-04-22 MED ORDER — SUCRALFATE 1 GM/10ML PO SUSP
1.0000 g | Freq: Two times a day (BID) | ORAL | Status: DC
  Administered 2017-04-22 – 2017-04-24 (×4): 1 g via ORAL
  Filled 2017-04-22 (×4): qty 10

## 2017-04-22 MED ORDER — FENTANYL CITRATE (PF) 100 MCG/2ML IJ SOLN: 25.0000 ug | INTRAMUSCULAR | Status: DC | PRN

## 2017-04-22 MED ORDER — IOPAMIDOL (ISOVUE-300) INJECTION 61%
INTRAVENOUS | Status: AC
  Administered 2017-04-22: 90 mL
  Filled 2017-04-22: qty 50

## 2017-04-22 MED ORDER — ALBUTEROL SULFATE HFA 108 (90 BASE) MCG/ACT IN AERS: 2.0000 | INHALATION_SPRAY | Freq: Four times a day (QID) | RESPIRATORY_TRACT | Status: DC | PRN

## 2017-04-22 MED ORDER — MIDAZOLAM HCL 2 MG/2ML IJ SOLN
INTRAMUSCULAR | Status: DC | PRN
  Administered 2017-04-22: 1 mg via INTRAVENOUS

## 2017-04-22 MED ORDER — FENTANYL CITRATE (PF) 100 MCG/2ML IJ SOLN
INTRAMUSCULAR | Status: AC | PRN
  Administered 2017-04-22: 25 ug via INTRAVENOUS
  Administered 2017-04-22: 50 ug via INTRAVENOUS

## 2017-04-22 MED ORDER — IOPAMIDOL (ISOVUE-300) INJECTION 61%
INTRAVENOUS | Status: AC
  Administered 2017-04-22: 5 mL
  Filled 2017-04-22: qty 100

## 2017-04-22 MED ORDER — PANTOPRAZOLE SODIUM 40 MG PO TBEC: 80.0000 mg | DELAYED_RELEASE_TABLET | Freq: Every day | ORAL | Status: DC

## 2017-04-22 MED ORDER — ONDANSETRON HCL 4 MG/2ML IJ SOLN
INTRAMUSCULAR | Status: DC | PRN
  Administered 2017-04-22: 4 mg via INTRAVENOUS

## 2017-04-22 MED ORDER — LIDOCAINE HCL 1 % IJ SOLN
INTRAMUSCULAR | Status: AC
  Filled 2017-04-22: qty 20

## 2017-04-22 MED ORDER — MIDAZOLAM HCL 2 MG/2ML IJ SOLN
INTRAMUSCULAR | Status: AC
  Filled 2017-04-22: qty 4

## 2017-04-22 MED ORDER — FENTANYL CITRATE (PF) 100 MCG/2ML IJ SOLN
INTRAMUSCULAR | Status: AC
  Filled 2017-04-22: qty 4

## 2017-04-22 MED ORDER — NORETHIN ACE-ETH ESTRAD-FE 1.5-30 MG-MCG PO TABS: 1.0000 | ORAL_TABLET | ORAL | Status: DC

## 2017-04-22 NOTE — Progress Notes (Signed)
Pt transported to endo at this time.  

## 2017-04-22 NOTE — Anesthesia Preprocedure Evaluation (Addendum)
Anesthesia Evaluation  Patient identified by MRN, date of birth, ID band Patient awake    Reviewed: Allergy & Precautions, NPO status , Patient's Chart, lab work & pertinent test results  History of Anesthesia Complications (+) PONV and history of anesthetic complications  Airway Mallampati: II  TM Distance: >3 FB Neck ROM: Full    Dental  (+) Dental Advisory Given, Chipped,    Pulmonary asthma ,    Pulmonary exam normal breath sounds clear to auscultation       Cardiovascular + DVT  Normal cardiovascular exam Rhythm:Regular Rate:Normal     Neuro/Psych negative neurological ROS  negative psych ROS   GI/Hepatic GERD  Medicated and Controlled,(+)     substance abuse  alcohol use, Hematemesis   Endo/Other  negative endocrine ROS  Renal/GU negative Renal ROS     Musculoskeletal  (+) Arthritis ,   Abdominal   Peds  Hematology  (+) Blood dyscrasia (Xarelto), anemia ,   Anesthesia Other Findings Day of surgery medications reviewed with the patient.  Reproductive/Obstetrics                            Anesthesia Physical Anesthesia Plan  ASA: III  Anesthesia Plan: MAC   Post-op Pain Management:    Induction: Intravenous  PONV Risk Score and Plan: 3 and Propofol infusion, Ondansetron and Dexamethasone  Airway Management Planned: Simple Face Mask  Additional Equipment:   Intra-op Plan:   Post-operative Plan:   Informed Consent: I have reviewed the patients History and Physical, chart, labs and discussed the procedure including the risks, benefits and alternatives for the proposed anesthesia with the patient or authorized representative who has indicated his/her understanding and acceptance.   Dental advisory given  Plan Discussed with: CRNA  Anesthesia Plan Comments: (Discussed risks/benefits/alternatives to MAC sedation including need for ventilatory support, hypotension, need  for conversion to general anesthesia.  All patient questions answered.  Patient/guardian wishes to proceed.)        Anesthesia Quick Evaluation

## 2017-04-22 NOTE — Op Note (Signed)
EGD showed esophagitis in distal esophagus from 30-35 cm, few linear and superficial ulcers in the same area, no active bleeding. Biopsies taken. Gastric cavity appeared unremarkable. Biopsies taken from antrum to rule out H. Pylori. Retroflexion was unremarkable. Duodenal bulb and duodenum appeared unremarkable.   Recommend: Resume regular diet. Okay to resume Xarelto in a.m.[name] PPI twice a day for 2 weeks, sucralfate twice a day for 2 weeks, if patient needs to be on anticoagulation- continue PPI daily indefinitely as long as anticoagulation is used. Abstinence from alcohol. Follow-up pathology as an outpatient. Treat H. pylori  If found. Okay to discharge from GI standpoint.  Area Marca Ancona, M.D.

## 2017-04-22 NOTE — Procedures (Signed)
Interventional Radiology Procedure Note  Procedure: Placement of a potentially retrievable IVC filter.   Vascular Access: Right IJ  Complications: None  Estimated Blood Loss: None  Recommendations: - Bedrest with HOB elevated for 2 hrs - F/U in IR clinic in 8 weeks to evaluate for filter retrieval  Signed,  Sterling Big, MD

## 2017-04-22 NOTE — Op Note (Signed)
[facility] Patient Name: Ruth Charles Procedure Date : 04/22/2017 MRN: [id number] Attending MD: Kerin Salen , MD Date of Birth: Apr 13, 1971 CSN: [id number] Age: 46 Admit Type: Inpatient Procedure:                Upper GI endoscopy Indications:              Hematemesis Providers:                Kerin Salen, MD, Dwain Sarna, RN, Margo Aye,                            Technician Referring MD:              Medicines:                Monitored Anesthesia Care Complications:            No immediate complications. Estimated Blood Loss:     Estimated blood loss: none. Procedure:                Pre-Anesthesia Assessment:                           - Prior to the procedure, a History and Physical                            was performed, and patient medications and                            allergies were reviewed. The patient's tolerance of                            previous anesthesia was also reviewed. The risks                            and benefits of the procedure and the sedation                            options and risks were discussed with the patient.                            All questions were answered, and informed consent                            was obtained. Prior Anticoagulants: The patient has                            taken Xarelto (rivaroxaban), last dose was 3 days                            prior to procedure. ASA Grade Assessment: III - A                            patient with severe systemic disease. After  reviewing the risks and benefits, the patient was                            deemed in satisfactory condition to undergo the                            procedure.                           After obtaining informed consent, the endoscope was                            passed under direct vision. Throughout the                            procedure, the patient's blood pressure, pulse, and   oxygen saturations were monitored continuously. The                            EG-[id number] ([id number]) scope was introduced through the                            mouth, and advanced to the second part of duodenum.                            The upper GI endoscopy was accomplished without                            difficulty. The patient tolerated the procedure                            well. Scope In: Scope Out: Findings:      LA Grade C (one or more mucosal breaks continuous between tops of 2 or       more mucosal folds, less than 75% circumference) esophagitis with no       bleeding was found 30 to 35 cm from the incisors. Biopsies were taken       with a cold forceps for histology.      Few linear and superficial esophageal ulcers and no stigmata of recent       bleeding were found 30 to 35 cm from the incisors.      A 5 cm hiatal hernia was present.      The entire examined stomach was normal. Biopsies were taken with a cold       forceps for Helicobacter pylori testing.      The cardia and gastric fundus were normal on retroflexion.      The examined duodenum was normal. Impression:               - LA Grade C esophagitis. Biopsied.                           - Esophageal ulcers.                           - 5 cm hiatal hernia.                           -  Normal stomach. Biopsied.                           - Normal examined duodenum. Moderate Sedation:      Patient did not receive moderate sedation for this procedure, but       instead received monitored anesthesia care. Recommendation:           - Resume regular diet.                           - Resume Xarelto (rivaroxaban) at prior dose                            tomorrow. Refer to primary physician for further                            adjustment of therapy.                           - Use Protonix (pantoprazole) 40 mg PO BID for 2                            weeks.                           - Use sucralfate suspension 1 gram PO  BID for 2                            weeks.                           - Advise abstinence from alcohol.                           - Return patient to hospital ward for ongoing care. Procedure Code(s):        --- Professional ---                           [phone], Esophagogastroduodenoscopy, flexible,                            transoral; with biopsy, single or multiple Diagnosis Code(s):        --- Professional ---                           K20.9, Esophagitis, unspecified                           K22.10, Ulcer of esophagus without bleeding                           K44.9, Diaphragmatic hernia without obstruction or                            gangrene  K92.0, Hematemesis CPT copyright 2016 [employer]. All rights reserved. The codes documented in this report are preliminary and upon coder review may  be revised to meet current compliance requirements. Kerin Salen, MD 04/22/2017 12:45:51 PM This report has been signed electronically. Number of Addenda: 0

## 2017-04-22 NOTE — Brief Op Note (Signed)
04/20/2017 - 04/22/2017  12:46 PM  PATIENT:  Jaynie Collins  46 y.o. female  PRE-OPERATIVE DIAGNOSIS:  hemetaemesis  POST-OPERATIVE DIAGNOSIS:  esophagitis  PROCEDURE:  Procedure(s): ESOPHAGOGASTRODUODENOSCOPY (EGD) (N/A)  SURGEON:  Surgeon(s) and Role:    Ronnette Juniper, MD - Primary  PHYSICIAN ASSISTANT:   ASSISTANTS: Cleda Daub, RN, Aneta Mins, Tech   ANESTHESIA:   MAC  EBL:  None  BLOOD ADMINISTERED:none  DRAINS: none   LOCAL MEDICATIONS USED:  NONE  SPECIMEN:  Biopsy / Limited Resection  DISPOSITION OF SPECIMEN:  PATHOLOGY  COUNTS:  YES  TOURNIQUET:  * No tourniquets in log *  DICTATION: .Dragon Dictation  PLAN OF CARE: Admit to inpatient   PATIENT DISPOSITION:  PACU - hemodynamically stable.   Delay start of Pharmacological VTE agent (>24hrs) due to surgical blood loss or risk of bleeding: no

## 2017-04-22 NOTE — Transfer of Care (Signed)
Immediate Anesthesia Transfer of Care Note  Patient: Ruth Charles  Procedure(s) Performed: ESOPHAGOGASTRODUODENOSCOPY (EGD) (N/A )  Patient Location: Endoscopy Unit  Anesthesia Type:MAC  Level of Consciousness: awake, alert  and oriented  Airway & Oxygen Therapy: Patient Spontanous Breathing and Patient connected to nasal cannula oxygen  Post-op Assessment: Report given to RN, Post -op Vital signs reviewed and stable and Patient moving all extremities  Post vital signs: Reviewed and stable  Last Vitals:  Vitals:   04/22/17 1106 04/22/17 1110  BP: (!) 161/101   Pulse: (!) 103   Resp: 14   Temp: 37 C   SpO2: 97% 97%    Last Pain:  Vitals:   04/22/17 1106  TempSrc: Oral  PainSc:          Complications: No apparent anesthesia complications

## 2017-04-22 NOTE — Progress Notes (Signed)
[facility] Attending Note  ADMISSION DATE:  04/20/2017  Consulting Physician:  Ripley Fraise, M.D. / EDP  Reason for Admission:  Airway Edema  Presenting HPI:  46 y.o. female with long-standing history of alcohol use. Recently diagnosed with DVT on 11/12 & started on Xarelto. Developed acute onset of hematemesis that per patient and wife was massive and frankly bloody. Questionable prior history of intermittent melena as well. Reports minimal abdominal pain. No subjective fever or chills. In the emergency department the patient was having significant pain in her throat and neck. Lateral film suggested supraglottic edema confirmed by ENT with endoscopy. No infectious symptoms preceding her acute onset of nausea and vomiting. No known history of withdrawal with attempts at alcohol abstinence. Patient reports no recent long trips, history of clots, family history of clots/unexplained deaths, or periods of immobility that would explain the cause for her DVT. Patient has been on hormone replacement for approximately 4 years. Given patient's potential for airway compromise she was admitted to the ICU.  Subjective:  No acute events overnight. Denies any chest pain or pressure. No abdominal pain. No nausea or vomiting. No melena or hematochezia.  Review of Systems:  No dyspnea or cough. No subjective fever or chills. No headache.  Temp:  [98.4 F (36.9 C)-99.3 F (37.4 C)] 98.4 F (36.9 C) (11/21 1250) Pulse Rate:  [72-107] 103 (11/21 1106) Resp:  [11-25] 18 (11/21 1250) BP: (124-163)/(79-104) 148/81 (11/21 1250) SpO2:  [93 %-100 %] 99 % (11/21 1250) Weight:  [157 lb (71.2 kg)-164 lb 3.9 oz (74.5 kg)] 157 lb (71.2 kg) (11/21 1106)   General: Wife at bedside. No distress. Comfortable. Integument: Warm. Dry. No rash. Cardiovascular: Regular rate. Regular rhythm. 1+ bilateral lower extremity edema. Pulmonary: Normal work of breathing. Speaking in complete sentences. Clear  with auscultation. Abdomen: Soft. Nontender. Normal bowel sounds. Neurological: Oriented 4. No meningismus. Grossly nonfocal.  LINES/TUBES: PIV  CBC Latest Ref Rng & Units 04/22/2017 04/21/2017 04/21/2017  WBC 4.0 - 10.5 K/uL 10.7(H) 13.8(H) 16.0(H)  Hemoglobin 12.0 - 15.0 g/dL 10.4(L) 10.5(L) 10.3(L)  Hematocrit 36.0 - 46.0 % 31.4(L) 31.1(L) 31.0(L)  Platelets 150 - 400 K/uL 258 279 284    BMP Latest Ref Rng & Units 04/22/2017 04/21/2017 04/20/2017  Glucose 65 - 99 mg/dL 68 115(H) 101(H)  BUN 6 - 20 mg/dL 6 5(L) 6  Creatinine 0.44 - 1.00 mg/dL 0.64 0.73 0.83  Sodium 135 - 145 mmol/L 135 135 138  Potassium 3.5 - 5.1 mmol/L 2.9(L) 3.4(L) 3.3(L)  Chloride 101 - 111 mmol/L 110 105 103  CO2 22 - 32 mmol/L 20(L) 20(L) 21(L)  Calcium 8.9 - 10.3 mg/dL 7.6(L) 7.7(L) 8.7(L)    Hepatic Function Latest Ref Rng & Units 04/22/2017 04/21/2017 04/20/2017  Total Protein 6.5 - 8.1 g/dL - 6.1(L) 7.2  Albumin 3.5 - 5.0 g/dL 3.0(L) 3.3(L) 3.7  AST 15 - 41 U/L - 43(H) 83(H)  ALT 14 - 54 U/L - 25 29  Alk Phosphatase 38 - 126 U/L - 52 66  Total Bilirubin 0.3 - 1.2 mg/dL - 1.9(H) 1.8(H)    IMAGING/STUDIES: VENOUS DUPLEX 11/12:  There is extensive DVT throughout the right common femoral, femoral, and popliteal veins. It is partially occlusive in the common femoral vein but occlusive in the femoral and popliteal veins. NECK/SOFT TISSUE X-RAY 11/19:  Prominence of the epiglottis and aryepiglottic folds with suggestion of mild focal narrowing of the subglottic tracheal airway. This may indicate epiglottitis or other inflammatory process. PORT  CXR 11/19:  Previously reviewed by me. No focal opacity, mass, or pleural effusion appreciated. Mediastinal and heart condor's normal.  MICROBIOLOGY: MRSA PCR 11/19:  Negative   ANTIBIOTICS: Rocephin 11/19 - 11/20  SIGNIFICANT EVENTS: 11.12 - Diagnosed w/ DVT & started on Xarelto 11/19 - Admit with GIB and upper airway edema  11/20 - IVC filter placement  complete & EGD pending  ASSESSMENT/PLAN:  46 y.o. female with history of chronic alcohol use. Recently diagnosed with DVT on 11/12 and started on Xarelto as an outpatient. Presented with recurrent hematemesis and upper airway edema that has significant improved.  1. Massive hematemesis/upper GI bleed: EGD pending. Protonix IV every 12 hours ordered. Further management as per GI. Continuing to trend CBC every 12 hours.  2. Acute right lower extremity DVT: Continuing to hold systemic and coagulation. Status post IVC filter placement. Consider restarting heparin drip for systemic anticoagulation depending upon findings on EGD & to ensure no rebleeding. Recommend treatment for DVT as per current guidelines 3-6 months & avoiding all hormone replacement therapy in the future.  3. Upper airway edema: Resolved. Likely secondary to recurrent emesis.  4. Chronic alcohol use: Continuing thiamine and folic acid supplementation. Continuing CIWA protocol.  5. Asthma: No signs of acute exacerbation. Continuing Duonebs twice a day.  6. Hypokalemia/hypophosphatemia: Switching to K-Phos 30 mEq IV 1. Repeat electrolyte panel in the morning.   Prophylaxis:  Protonix IV q12hr.  Diet:  Nothing by mouth pending EGD. Code Status:  Full Code. Disposition:  Transferring patient to stepdown unit. Family Update:  Patient and wife updated during rounds.  I have spent a total of 37 minutes of time today caring for the patient, reviewing the patient's electronic medical record, and with more than 50% of that time spent coordinating transfer of care with the patient as well as reviewing the continuing plan of care with the patient at bedside.  TR[facility] to assume care & PCCM off as of 11/22.  Sonia Baller Ashok Cordia, M.D. [facility] Pager:  [phone] After 7pm or if no response, call [phone] 2:26 PM 04/22/17

## 2017-04-22 NOTE — Anesthesia Postprocedure Evaluation (Signed)
Anesthesia Post Note  Patient: Ruth Charles  Procedure(s) Performed: ESOPHAGOGASTRODUODENOSCOPY (EGD) (N/A )     Patient location during evaluation: Endoscopy Anesthesia Type: MAC Level of consciousness: awake and alert Pain management: pain level controlled Vital Signs Assessment: post-procedure vital signs reviewed and stable Respiratory status: spontaneous breathing, nonlabored ventilation, respiratory function stable and patient connected to nasal cannula oxygen Cardiovascular status: stable and blood pressure returned to baseline Postop Assessment: no apparent nausea or vomiting Anesthetic complications: no    Last Vitals:  Vitals:   04/22/17 1110 04/22/17 1250  BP:  (!) 148/81  Pulse:    Resp:  18  Temp:  36.9 C  SpO2: 97% 99%    Last Pain:  Vitals:   04/22/17 1250  TempSrc: Oral  PainSc:                  Catalina Gravel

## 2017-04-22 NOTE — Interval H&P Note (Signed)
History and Physical Interval Note: 46/female for EGD for hematemesis.  04/22/2017 11:28 AM  Ruth Charles  has presented today for EGD, with the diagnosis of hemetaemesis  The various methods of treatment have been discussed with the patient and family. After consideration of risks, benefits and other options for treatment, the patient has consented to  Procedure(s): ESOPHAGOGASTRODUODENOSCOPY (EGD) (N/A) as a surgical intervention .  The patient's history has been reviewed, patient examined, no change in status, stable for surgery.  I have reviewed the patient's chart and labs.  Questions were answered to the patient's satisfaction.     Kerin Salen

## 2017-04-22 NOTE — Progress Notes (Signed)
Pt transported to IR at this time via hospital bed. Wife states she will wait in the waiting room.

## 2017-04-23 DIAGNOSIS — J384 Edema of larynx: Principal | ICD-10-CM

## 2017-04-23 DIAGNOSIS — K920 Hematemesis: Secondary | ICD-10-CM

## 2017-04-23 DIAGNOSIS — I82411 Acute embolism and thrombosis of right femoral vein: Secondary | ICD-10-CM

## 2017-04-23 DIAGNOSIS — E876 Hypokalemia: Secondary | ICD-10-CM

## 2017-04-23 LAB — RENAL FUNCTION PANEL
ALBUMIN: 3.1 g/dL — AB (ref 3.5–5.0)
Anion gap: 8 (ref 5–15)
BUN: 6 mg/dL (ref 6–20)
CALCIUM: 7.8 mg/dL — AB (ref 8.9–10.3)
CO2: 21 mmol/L — ABNORMAL LOW (ref 22–32)
CREATININE: 0.64 mg/dL (ref 0.44–1.00)
Chloride: 105 mmol/L (ref 101–111)
Glucose, Bld: 106 mg/dL — ABNORMAL HIGH (ref 65–99)
PHOSPHORUS: 2.2 mg/dL — AB (ref 2.5–4.6)
Potassium: 3.3 mmol/L — ABNORMAL LOW (ref 3.5–5.1)
Sodium: 134 mmol/L — ABNORMAL LOW (ref 135–145)

## 2017-04-23 LAB — CBC WITH DIFFERENTIAL/PLATELET
BASOS PCT: 0 %
Basophils Absolute: 0 10*3/uL (ref 0.0–0.1)
EOS ABS: 0 10*3/uL (ref 0.0–0.7)
EOS PCT: 0 %
HCT: 33 % — ABNORMAL LOW (ref 36.0–46.0)
HEMOGLOBIN: 11 g/dL — AB (ref 12.0–15.0)
LYMPHS PCT: 7 %
Lymphs Abs: 0.6 10*3/uL — ABNORMAL LOW (ref 0.7–4.0)
MCH: 38.9 pg — ABNORMAL HIGH (ref 26.0–34.0)
MCHC: 33.3 g/dL (ref 30.0–36.0)
MCV: 116.6 fL — ABNORMAL HIGH (ref 78.0–100.0)
MONO ABS: 0.6 10*3/uL (ref 0.1–1.0)
Monocytes Relative: 7 %
NEUTROS PCT: 86 %
Neutro Abs: 7.6 10*3/uL (ref 1.7–7.7)
PLATELETS: 215 10*3/uL (ref 150–400)
RBC: 2.83 MIL/uL — AB (ref 3.87–5.11)
RDW: 15.9 % — ABNORMAL HIGH (ref 11.5–15.5)
WBC: 8.8 10*3/uL (ref 4.0–10.5)

## 2017-04-23 LAB — CBC
HCT: 36.1 % (ref 36.0–46.0)
Hemoglobin: 12.3 g/dL (ref 12.0–15.0)
MCH: 39.4 pg — ABNORMAL HIGH (ref 26.0–34.0)
MCHC: 34.1 g/dL (ref 30.0–36.0)
MCV: 115.7 fL — ABNORMAL HIGH (ref 78.0–100.0)
PLATELETS: 208 10*3/uL (ref 150–400)
RBC: 3.12 MIL/uL — AB (ref 3.87–5.11)
RDW: 15.4 % (ref 11.5–15.5)
WBC: 9.7 10*3/uL (ref 4.0–10.5)

## 2017-04-23 LAB — HEPARIN LEVEL (UNFRACTIONATED): Heparin Unfractionated: 0.24 IU/mL — ABNORMAL LOW (ref 0.30–0.70)

## 2017-04-23 LAB — MAGNESIUM: MAGNESIUM: 1.8 mg/dL (ref 1.7–2.4)

## 2017-04-23 LAB — GLUCOSE, CAPILLARY: Glucose-Capillary: 71 mg/dL (ref 65–99)

## 2017-04-23 LAB — APTT: aPTT: 41 seconds — ABNORMAL HIGH (ref 24–36)

## 2017-04-23 MED ORDER — VITAMIN B-1 100 MG PO TABS
100.0000 mg | ORAL_TABLET | Freq: Every day | ORAL | Status: DC
  Administered 2017-04-24: 100 mg via ORAL
  Filled 2017-04-23 (×2): qty 1

## 2017-04-23 MED ORDER — POTASSIUM CHLORIDE CRYS ER 20 MEQ PO TBCR: 40.0000 meq | EXTENDED_RELEASE_TABLET | Freq: Two times a day (BID) | ORAL | Status: DC

## 2017-04-23 MED ORDER — FOLIC ACID 1 MG PO TABS
1.0000 mg | ORAL_TABLET | Freq: Every day | ORAL | Status: DC
  Administered 2017-04-24: 1 mg via ORAL
  Filled 2017-04-23 (×2): qty 1

## 2017-04-23 MED ORDER — ALBUTEROL SULFATE (2.5 MG/3ML) 0.083% IN NEBU: 2.5000 mg | INHALATION_SOLUTION | RESPIRATORY_TRACT | Status: DC | PRN

## 2017-04-23 MED ORDER — K PHOS MONO-SOD PHOS DI & MONO 155-852-130 MG PO TABS
250.0000 mg | ORAL_TABLET | Freq: Three times a day (TID) | ORAL | Status: DC
  Administered 2017-04-23 – 2017-04-24 (×4): 250 mg via ORAL
  Filled 2017-04-23 (×5): qty 1

## 2017-04-23 MED ORDER — HEPARIN (PORCINE) IN NACL 100-0.45 UNIT/ML-% IJ SOLN
1250.0000 [IU]/h | INTRAMUSCULAR | Status: DC
  Administered 2017-04-23: 1100 [IU]/h via INTRAVENOUS
  Administered 2017-04-24: 1250 [IU]/h via INTRAVENOUS
  Filled 2017-04-23 (×2): qty 250

## 2017-04-23 MED ORDER — ALPRAZOLAM 0.25 MG PO TABS: 0.2500 mg | ORAL_TABLET | Freq: Three times a day (TID) | ORAL | Status: DC | PRN

## 2017-04-23 MED ORDER — POTASSIUM CHLORIDE CRYS ER 20 MEQ PO TBCR
20.0000 meq | EXTENDED_RELEASE_TABLET | Freq: Two times a day (BID) | ORAL | Status: AC
  Administered 2017-04-23 – 2017-04-24 (×3): 20 meq via ORAL
  Filled 2017-04-23 (×3): qty 1

## 2017-04-23 NOTE — Progress Notes (Signed)
Attempt to call report. Nurse doing a dressing change on a patient. Will call back when finished. Huntley Estelle E, RN 04/23/2017 10:45 AM

## 2017-04-23 NOTE — Progress Notes (Signed)
ANTICOAGULATION CONSULT NOTE - Follow up Consult  Pharmacy Consult for heparin Indication: DVT  Allergies  Allergen Reactions  . Corn-Containing Products Other (See Comments)    Chest congestion  . Gluten Meal Other (See Comments)    Nausea and diarrhea  . Lactose Intolerance (Gi) Other (See Comments)    Chest congestion  . Remicade [Infliximab] Shortness Of Breath and Other (See Comments)    "Lightheadedness" also  . Cosentyx [Secukinumab] Other (See Comments)    Worsened symptoms  . Enbrel [Etanercept] Other (See Comments)    Psoriasis worsened  . Humira [Adalimumab] Other (See Comments)    Psoriasis worsened  . Sulfa Antibiotics Hives  . Tape Other (See Comments)    TEARS THE SKIN!! COBAN WRAP IS TOLERATED  . Latex Rash    Patient Measurements: Height: 5\' 4"  (162.6 cm) Weight: 161 lb 6 oz (73.2 kg) IBW/kg (Calculated) : 54.7 Heparin Dosing Weight: 69kg  Vital Signs: Temp: 98.6 F (37 C) (11/22 1456) Temp Source: Oral (11/22 1456) BP: 153/92 (11/22 1456) Pulse Rate: 87 (11/22 1456)  Labs: Recent Labs    04/21/17 0514  04/22/17 0444 04/22/17 1649 04/23/17 0223 04/23/17 1811  HGB  --    < > 10.4* 11.7* 11.0* 12.3  HCT  --    < > 31.4* 34.5* 33.0* 36.1  PLT  --    < > 258 226 215 208  APTT  --   --  24  --   --  41*  LABPROT  --   --  14.1  --   --   --   INR  --   --  1.10  --   --   --   HEPARINUNFRC  --   --   --   --   --  0.24*  CREATININE 0.73  --  0.64  --  0.64  --    < > = values in this interval not displayed.    Estimated Creatinine Clearance: 84.3 mL/min (by C-G formula based on SCr of 0.64 mg/dL).  Assessment: 45 yof initially presented with hematemesis after recently starting xarelto for acute DVT. Xarelto held and IVC filter placed. No active bleeding on EGD so resuming anticoagulation. To start with IV heparin with plans to resume oral anticoagulation if no re-bleeding.   CBC stable this evening, HL and aPTT subtherapeutic at 0.24 and  41  Goal of Therapy:  Heparin level 0.3-0.7 units/ml  aPTT 66-102 seconds Monitor platelets by anticoagulation protocol: Yes   Plan:  Increase Heparin gtt to 1250 units/hr Check an 8 hr heparin level and aPTT to ensure xarelto if out of her system Daily heparin level and CBC F/u plans for oral anticoagulation   Thank you for allowing 52 to participate in this patients care.  [place], PharmD If after 3:30p, please call main pharmacy at: [phone] 04/23/2017 7:31 PM

## 2017-04-23 NOTE — Progress Notes (Signed)
[facility] TEAM 1 - Stepdown/ICU TEAM  Ruth Charles  KGU:[id number] DOB: 20-Oct-1970 DOA: 04/20/2017 PCP: Maurice Small, MD    Brief Narrative:  46 y.o. female with long-standing history of alcohol use who was diagnosed with DVT on 11/12 & started on Xarelto and subsequently developed the acute onset of massive hematemesis.  In the ED the patient was reporting pain in her throat and neck. Lateral film suggested supraglottic edema confirmed by ENT with endoscopy.  Given patient's potential for airway compromise she was admitted to the ICU.  Significant Events: 11/12 - Diagnosed w/ extensive acute R LE DVT & started on Xarelto 11/19 - Admit with GIB and upper airway edema  11/20 - IVC filter placement complete 11/21 - EGD  11/22 - TRH assumed care   Subjective: Resting comfortably.  Tolerating a diet w/o trouble.  Denies cp, sob, n/v, or abdom pain.  Reports she is having brown stools.    Assessment & Plan:  Massive hematemesis/upper GI bleed EGD noted esophagits w/ a few superficial ulcers - bipsies were taken - remainder of EGD unremarkable - cleared for diet per GI who suggest PPI and carafate BID for 2 weeks   Acute right lower extremity DVT now has IVC filter - plan to tx initially w/ heparin drip to ensure no rebleeding - plan to treat for DVT for 3-6 months & avoiding all hormone replacement therapy in the future  Upper airway edema resolved - secondary to recurrent emesis  Chronic alcohol use CIWA protocol being utilized   Asthma quiescent   Hypokalemia Replace and follow   Hypophosphatemia Cont to supplement and follow  DVT prophylaxis: IV heparin  Code Status: FULL CODE Family Communication: no family present at time of exam  Disposition Plan: transfer to med bed - begin IV heparin - watch for bleeding - potential transition to xarelto 11/23 PM w/ d/c home if no bleeding   Consultants:  GI IR  PCCM  Antimicrobials:  Rocephin 11/19 >  11/20  Objective: Blood pressure (!) 117/102, pulse 76, temperature 98.3 F (36.8 C), temperature source Oral, resp. rate 18, height 5\' 4"  (1.626 m), weight 73.2 kg (161 lb 6 oz), last menstrual period 04/20/2017, SpO2 98 %.  Intake/Output Summary (Last 24 hours) at 04/23/2017 0830 Last data filed at 04/22/2017 2000 Gross per 24 hour  Intake 930 ml  Output 750 ml  Net 180 ml   Filed Weights   04/22/17 0500 04/22/17 1106 04/23/17 0412  Weight: 74.5 kg (164 lb 3.9 oz) 71.2 kg (157 lb) 73.2 kg (161 lb 6 oz)    Examination: General: No acute respiratory distress Lungs: Clear to auscultation bilaterally without wheezes or crackles Cardiovascular: Regular rate and rhythm without murmur gallop or rub normal S1 and S2 Abdomen: Nontender, nondistended, soft, bowel sounds positive, no rebound, no ascites, no appreciable mass Extremities: R LE 2+ edema - trace edema L LE   CBC: Recent Labs  Lab 04/21/17 0748 04/21/17 1841 04/22/17 0444 04/22/17 1649 04/23/17 0223  WBC 16.0* 13.8* 10.7* 10.5 8.8  NEUTROABS 15.3* 12.4* 8.8* 10.2* 7.6  HGB 10.3* 10.5* 10.4* 11.7* 11.0*  HCT 31.0* 31.1* 31.4* 34.5* 33.0*  MCV 116.1* 116.9* 117.6* 116.9* 116.6*  PLT 284 279 258 226 215   Basic Metabolic Panel: Recent Labs  Lab 04/20/17 0515 04/21/17 0514 04/22/17 0444 04/23/17 0223  NA 138 135 135 134*  K 3.3* 3.4* 2.9* 3.3*  CL 103 105 110 105  CO2 21* 20* 20* 21*  GLUCOSE  101* 115* 68 106*  BUN 6 5* 6 6  CREATININE 0.83 0.73 0.64 0.64  CALCIUM 8.7* 7.7* 7.6* 7.8*  MG  --  1.7 1.8 1.8  PHOS  --  1.9* 1.4* 2.2*   GFR: Estimated Creatinine Clearance: 84.3 mL/min (by C-G formula based on SCr of 0.64 mg/dL).  Liver Function Tests: Recent Labs  Lab 04/20/17 0515 04/21/17 0514 04/22/17 0444 04/23/17 0223  AST 83* 43*  --   --   ALT 29 25  --   --   ALKPHOS 66 52  --   --   BILITOT 1.8* 1.9*  --   --   PROT 7.2 6.1*  --   --   ALBUMIN 3.7 3.3* 3.0* 3.1*   Recent Labs  Lab  04/20/17 0515  LIPASE 65*   Coagulation Profile: Recent Labs  Lab 04/20/17 0515 04/22/17 0444  INR 1.69 1.10    CBG: Recent Labs  Lab 04/21/17 1520 04/21/17 1944 04/22/17 0009 04/22/17 0932 04/22/17 1538  GLUCAP 89 88 85 79 148*    Recent Results (from the past 240 hour(s))  MRSA PCR Screening     Status: None   Collection Time: 04/20/17  5:51 PM  Result Value Ref Range Status   MRSA by PCR NEGATIVE NEGATIVE Final    Comment:        The GeneXpert MRSA Assay (FDA approved for NASAL specimens only), is one component of a comprehensive MRSA colonization surveillance program. It is not intended to diagnose MRSA infection nor to guide or monitor treatment for MRSA infections.      Scheduled Meds: . B-complex with vitamin C  1 tablet Oral Daily  . folic acid  1 mg Intravenous Daily  . ipratropium-albuterol  3 mL Nebulization BID  . mometasone-formoterol  2 puff Inhalation BID  . pantoprazole  40 mg Oral BID AC  . sucralfate  1 g Oral BID  . thiamine injection  100 mg Intravenous Daily     LOS: 3 days   Lonia Blood, MD [facility] Office  [phone] Pager - Text Page per Loretha Stapler as per below:  On-Call/Text Page:      [email or web address]      password [email or web address]  If 7PM-7AM, please contact night-coverage [email or web address] Password [email or web address] 04/23/2017, 8:30 AM

## 2017-04-23 NOTE — Progress Notes (Signed)
ANTICOAGULATION CONSULT NOTE - Initial Consult  Pharmacy Consult for heparin Indication: DVT  Allergies  Allergen Reactions  . Corn-Containing Products Other (See Comments)    Chest congestion  . Gluten Meal Other (See Comments)    Nausea and diarrhea  . Lactose Intolerance (Gi) Other (See Comments)    Chest congestion  . Remicade [Infliximab] Shortness Of Breath and Other (See Comments)    "Lightheadedness" also  . Cosentyx [Secukinumab] Other (See Comments)    Worsened symptoms  . Enbrel [Etanercept] Other (See Comments)    Psoriasis worsened  . Humira [Adalimumab] Other (See Comments)    Psoriasis worsened  . Sulfa Antibiotics Hives  . Tape Other (See Comments)    TEARS THE SKIN!! COBAN WRAP IS TOLERATED  . Latex Rash    Patient Measurements: Height: 5\' 4"  (162.6 cm) Weight: 161 lb 6 oz (73.2 kg) IBW/kg (Calculated) : 54.7 Heparin Dosing Weight: 69kg  Vital Signs: Temp: 98.3 F (36.8 C) (11/22 0809) Temp Source: Oral (11/22 0809) BP: 117/102 (11/22 0800) Pulse Rate: 76 (11/22 0800)  Labs: Recent Labs    04/21/17 0514  04/22/17 0444 04/22/17 1649 04/23/17 0223  HGB  --    < > 10.4* 11.7* 11.0*  HCT  --    < > 31.4* 34.5* 33.0*  PLT  --    < > 258 226 215  APTT  --   --  24  --   --   LABPROT  --   --  14.1  --   --   INR  --   --  1.10  --   --   CREATININE 0.73  --  0.64  --  0.64   < > = values in this interval not displayed.    Estimated Creatinine Clearance: 84.3 mL/min (by C-G formula based on SCr of 0.64 mg/dL).   Medical History: Past Medical History:  Diagnosis Date  . Arthritis   . Asthma   . Bronchitis    hx of   . Complication of anesthesia   . Cough   . Diarrhea   . Fatigue   . Frequent urination at night   . GERD (gastroesophageal reflux disease)   . Gluten intolerance   . Lactose intolerance   . Mumps    hx of   . Night sweats   . PONV (postoperative nausea and vomiting)   . Seasonal allergies   . Shortness of breath  dyspnea   . Streptococcal infection    hx of   . Tinnitus   . Wheezing     Medications:  Infusions:  . sodium chloride Stopped (04/22/17 2000)  . heparin      Assessment: 46 yof initially presented with hematemesis after recently starting xarelto for acute DVT. Xarelto held and IVC filter placed. No active bleeding on EGD so resuming anticoagulation. To start with IV heparin with plans to resume oral anticoagulation if no re-bleeding.   Goal of Therapy:  Heparin level 0.3-0.7 units/ml Monitor platelets by anticoagulation protocol: Yes   Plan:  Heparin gtt 1100 units/hr Check an 8 hr heparin level and aPTT to ensure xarelto if out of her system Daily heparin level and CBC F/u plans for oral anticoagulation  Ruth Charles, 04/24/17 04/23/2017,9:37 AM

## 2017-04-24 ENCOUNTER — Encounter (HOSPITAL_COMMUNITY): Payer: Self-pay | Admitting: Gastroenterology

## 2017-04-24 DIAGNOSIS — I825Z9 Chronic embolism and thrombosis of unspecified deep veins of unspecified distal lower extremity: Secondary | ICD-10-CM

## 2017-04-24 DIAGNOSIS — J988 Other specified respiratory disorders: Secondary | ICD-10-CM

## 2017-04-24 DIAGNOSIS — J384 Edema of larynx: Secondary | ICD-10-CM | POA: Diagnosis present

## 2017-04-24 DIAGNOSIS — K920 Hematemesis: Secondary | ICD-10-CM | POA: Diagnosis present

## 2017-04-24 DIAGNOSIS — R0602 Shortness of breath: Secondary | ICD-10-CM | POA: Diagnosis present

## 2017-04-24 LAB — MAGNESIUM: Magnesium: 1.9 mg/dL (ref 1.7–2.4)

## 2017-04-24 LAB — PHOSPHORUS: Phosphorus: 3.3 mg/dL (ref 2.5–4.6)

## 2017-04-24 LAB — CBC
HCT: 33.5 % — ABNORMAL LOW (ref 36.0–46.0)
HEMATOCRIT: 32.6 % — AB (ref 36.0–46.0)
HEMOGLOBIN: 10.8 g/dL — AB (ref 12.0–15.0)
HEMOGLOBIN: 11.5 g/dL — AB (ref 12.0–15.0)
MCH: 38.2 pg — ABNORMAL HIGH (ref 26.0–34.0)
MCH: 39.8 pg — AB (ref 26.0–34.0)
MCHC: 33.1 g/dL (ref 30.0–36.0)
MCHC: 34.3 g/dL (ref 30.0–36.0)
MCV: 115.2 fL — ABNORMAL HIGH (ref 78.0–100.0)
MCV: 115.9 fL — AB (ref 78.0–100.0)
PLATELETS: 196 10*3/uL (ref 150–400)
Platelets: 176 10*3/uL (ref 150–400)
RBC: 2.83 MIL/uL — ABNORMAL LOW (ref 3.87–5.11)
RBC: 2.89 MIL/uL — AB (ref 3.87–5.11)
RDW: 15.3 % (ref 11.5–15.5)
RDW: 15.5 % (ref 11.5–15.5)
WBC: 6.5 10*3/uL (ref 4.0–10.5)
WBC: 7.8 10*3/uL (ref 4.0–10.5)

## 2017-04-24 LAB — COMPREHENSIVE METABOLIC PANEL
ALBUMIN: 2.9 g/dL — AB (ref 3.5–5.0)
ALK PHOS: 48 U/L (ref 38–126)
ALT: 52 U/L (ref 14–54)
ANION GAP: 7 (ref 5–15)
AST: 85 U/L — ABNORMAL HIGH (ref 15–41)
BUN: 5 mg/dL — ABNORMAL LOW (ref 6–20)
CALCIUM: 8 mg/dL — AB (ref 8.9–10.3)
CO2: 24 mmol/L (ref 22–32)
Chloride: 108 mmol/L (ref 101–111)
Creatinine, Ser: 0.72 mg/dL (ref 0.44–1.00)
GFR calc Af Amer: >60 mL/min (ref >60)
GFR calc non Af Amer: >60 mL/min (ref >60)
GLUCOSE: 76 mg/dL (ref 65–99)
Potassium: 3.2 mmol/L — ABNORMAL LOW (ref 3.5–5.1)
SODIUM: 139 mmol/L (ref 135–145)
Total Bilirubin: 1.5 mg/dL — ABNORMAL HIGH (ref 0.3–1.2)
Total Protein: 5.4 g/dL — ABNORMAL LOW (ref 6.5–8.1)

## 2017-04-24 LAB — HEPARIN LEVEL (UNFRACTIONATED)
HEPARIN UNFRACTIONATED: 0.43 [IU]/mL (ref 0.30–0.70)
Heparin Unfractionated: 0.35 IU/mL (ref 0.30–0.70)

## 2017-04-24 MED ORDER — SUCRALFATE 1 GM/10ML PO SUSP: 1.0000 g | Freq: Two times a day (BID) | ORAL | 0 refills | Status: AC

## 2017-04-24 MED ORDER — POTASSIUM CHLORIDE CRYS ER 20 MEQ PO TBCR: 20.0000 meq | EXTENDED_RELEASE_TABLET | Freq: Every day | ORAL | Status: DC

## 2017-04-24 MED ORDER — RIVAROXABAN 15 MG PO TABS
15.0000 mg | ORAL_TABLET | Freq: Two times a day (BID) | ORAL | Status: DC
  Administered 2017-04-24: 15 mg via ORAL
  Filled 2017-04-24: qty 1

## 2017-04-24 MED ORDER — PANTOPRAZOLE SODIUM 40 MG PO TBEC: 40.0000 mg | DELAYED_RELEASE_TABLET | Freq: Two times a day (BID) | ORAL | 0 refills | Status: AC

## 2017-04-24 MED ORDER — RIVAROXABAN 20 MG PO TABS: 20.0000 mg | ORAL_TABLET | Freq: Every day | ORAL | Status: DC

## 2017-04-24 MED ORDER — POTASSIUM CHLORIDE CRYS ER 20 MEQ PO TBCR: 20.0000 meq | EXTENDED_RELEASE_TABLET | Freq: Every day | ORAL | 0 refills | Status: AC

## 2017-04-24 MED ORDER — POTASSIUM CHLORIDE CRYS ER 20 MEQ PO TBCR
40.0000 meq | EXTENDED_RELEASE_TABLET | Freq: Once | ORAL | Status: AC
  Administered 2017-04-24: 40 meq via ORAL
  Filled 2017-04-24: qty 2

## 2017-04-24 NOTE — Discharge Instructions (Signed)
Peptic Ulcer A peptic ulcer is a sore in the lining of the esophagus (esophageal ulcer), the stomach (gastric ulcer), or the first part of the small intestine (duodenal ulcer). The ulcer causes gradual wearing away (erosion) into the deeper tissue. What are the causes? Normally, the lining of the stomach and the small intestine protects itself from the acid that digests food. The protective lining can be damaged by:  An infection caused by a germ (bacterium) called Helicobacter pylori or H. pylori.  Regular use of NSAIDs, such as ibuprofen or aspirin.  Rare tumors in the stomach, small intestine, or pancreas (Zollinger-Ellison syndrome).  What increases the risk? The following factors may make you more likely to develop this condition:  Smoking.  Having a family history of ulcer disease.  What are the signs or symptoms? Symptoms of this condition include:  Burning pain or gnawing in the area between the chest and the belly button. The pain may be worse on an empty stomach and at night.  Heartburn.  Nausea and vomiting.  Bloating.  If the ulcer results in bleeding, it can cause:  Black, tarry stools.  Vomiting of bright red blood.  Vomiting of material that looks like coffee grounds.  How is this diagnosed? This condition may be diagnosed based on:  Medical history and physical exam.  Various tests or procedures, such as: ? Blood tests, stool tests, or breath tests to check for the H. pylori bacterium. ? An X-ray exam (upper gastrointestinal series) of the esophagus, stomach, and small intestine. ? Upper endoscopy. The health care provider examines the esophagus, stomach, and small intestine using a small flexible tube that has a video camera at the end. ? Biopsy. A tissue sample is removed to be examined under a microscope.  How is this treated? Treatment for this condition may include:  Eliminating the cause of the ulcer, such as smoking or the use of NSAIDs or  alcohol.  Medicines to reduce the amount of acid in your digestive tract.  Antibiotic medicines, if the ulcer is caused by the H. pylori bacterium.  An upper endoscopy to treat a bleeding ulcer.  Surgery, if the bleeding is severe or if the ulcer created a hole somewhere in the digestive system.  Follow these instructions at home:  Avoid alcohol and caffeine.  Do not use any tobacco products, such as cigarettes, chewing tobacco, and e-cigarettes. If you need help quitting, ask your health care provider.  Take over-the-counter and prescription medicines only as told by your health care provider. Do not use over-the-counter medicines in place of prescription medicines unless your health care provider approves.  Keep all follow-up visits as told by your health care provider. This is important. Contact a health care provider if:  Your symptoms do not improve within 7 days of starting treatment.  You have ongoing indigestion or heartburn. Get help right away if:  You have sudden, sharp, or persistent pain in your abdomen.  You have bloody or dark black, tarry stools.  You vomit blood or material that looks like coffee grounds.  You become light-headed or you feel faint.  You become weak.  You become sweaty or clammy. This information is not intended to replace advice given to you by your health care provider. Make sure you discuss any questions you have with your health care provider. Document Released: 05/16/2000 Document Revised: 10/22/2015 Document Reviewed: 02/17/2015 Elsevier Interactive Patient Education  2018 [employer].  Rivaroxaban oral tablets What is this medicine? RIVAROXABAN (ri  va ROX a ban) is an anticoagulant (blood thinner). It is used to treat blood clots in the lungs or in the veins. It is also used after knee or hip surgeries to prevent blood clots. It is also used to lower the chance of stroke in people with a medical condition called atrial  fibrillation. This medicine may be used for other purposes; ask your health care provider or pharmacist if you have questions. COMMON BRAND NAME(S): Xarelto, Xarelto Starter Pack What should I tell my health care provider before I take this medicine? They need to know if you have any of these conditions: -bleeding disorders -bleeding in the brain -blood in your stools (black or tarry stools) or if you have blood in your vomit -history of stomach bleeding -kidney disease -liver disease -low blood counts, like low white cell, platelet, or red cell counts -recent or planned spinal or epidural procedure -take medicines that treat or prevent blood clots -an unusual or allergic reaction to rivaroxaban, other medicines, foods, dyes, or preservatives -pregnant or trying to get pregnant -breast-feeding How should I use this medicine? Take this medicine by mouth with a glass of water. Follow the directions on the prescription label. Take your medicine at regular intervals. Do not take it more often than directed. Do not stop taking except on your doctor's advice. Stopping this medicine may increase your risk of a blood clot. Be sure to refill your prescription before you run out of medicine. If you are taking this medicine after hip or knee replacement surgery, take it with or without food. If you are taking this medicine for atrial fibrillation, take it with your evening meal. If you are taking this medicine to treat blood clots, take it with food at the same time each day. If you are unable to swallow your tablet, you may crush the tablet and mix it in applesauce. Then, immediately eat the applesauce. You should eat more food right after you eat the applesauce containing the crushed tablet. Talk to your pediatrician regarding the use of this medicine in children. Special care may be needed. Overdosage: If you think you have taken too much of this medicine contact a poison control center or emergency  room at once. NOTE: This medicine is only for you. Do not share this medicine with others. What if I miss a dose? If you take your medicine once a day and miss a dose, take the missed dose as soon as you remember. If you take your medicine twice a day and miss a dose, take the missed dose immediately. In this instance, 2 tablets may be taken at the same time. The next day you should take 1 tablet twice a day as directed. What may interact with this medicine? Do not take this medicine with any of the following medications: -defibrotide This medicine may also interact with the following medications: -aspirin and aspirin-like medicines -certain antibiotics like erythromycin, azithromycin, and clarithromycin -certain medicines for fungal infections like ketoconazole and itraconazole -certain medicines for irregular heart beat like amiodarone, quinidine, dronedarone -certain medicines for seizures like carbamazepine, phenytoin -certain medicines that treat or prevent blood clots like warfarin, enoxaparin, and dalteparin -conivaptan -diltiazem -felodipine -indinavir -lopinavir; ritonavir -NSAIDS, medicines for pain and inflammation, like ibuprofen or naproxen -ranolazine -rifampin -ritonavir -SNRIs, medicines for depression, like desvenlafaxine, duloxetine, levomilnacipran, venlafaxine -SSRIs, medicines for depression, like citalopram, escitalopram, fluoxetine, fluvoxamine, paroxetine, sertraline -St. John's wort -verapamil This list may not describe all possible interactions. Give your health care provider a  list of all the medicines, herbs, non-prescription drugs, or dietary supplements you use. Also tell them if you smoke, drink alcohol, or use illegal drugs. Some items may interact with your medicine. What should I watch for while using this medicine? Visit your doctor or health care professional for regular checks on your progress. Notify your doctor or health care professional and seek  emergency treatment if you develop breathing problems; changes in vision; chest pain; severe, sudden headache; pain, swelling, warmth in the leg; trouble speaking; sudden numbness or weakness of the face, arm or leg. These can be signs that your condition has gotten worse. If you are going to have surgery or other procedure, tell your doctor that you are taking this medicine. What side effects may I notice from receiving this medicine? Side effects that you should report to your doctor or health care professional as soon as possible: -allergic reactions like skin rash, itching or hives, swelling of the face, lips, or tongue -back pain -redness, blistering, peeling or loosening of the skin, including inside the mouth -signs and symptoms of bleeding such as bloody or black, tarry stools; red or dark-brown urine; spitting up blood or brown material that looks like coffee grounds; red spots on the skin; unusual bruising or bleeding from the eye, gums, or nose Side effects that usually do not require medical attention (report to your doctor or health care professional if they continue or are bothersome): -dizziness -muscle pain This list may not describe all possible side effects. Call your doctor for medical advice about side effects. You may report side effects to FDA at 1-800-FDA-1088. Where should I keep my medicine? Keep out of the reach of children. Store at room temperature between 15 and 30 degrees C (59 and 86 degrees F). Throw away any unused medicine after the expiration date. NOTE: This sheet is a summary. It may not cover all possible information. If you have questions about this medicine, talk to your doctor, pharmacist, or health care provider.  2018 Elsevier/Gold Standard (2016-02-06 16:29:33)

## 2017-04-24 NOTE — Progress Notes (Signed)
Lucky Rathke to be D/C'd to home per MD order.  Discussed with the patient and all questions fully answered.  VSS, Skin clean, dry and intact without evidence of skin break down, no evidence of skin tears noted. IV catheter discontinued intact. Site without signs and symptoms of complications. Dressing and pressure applied.  An After Visit Summary was printed and given to the patient. Patient received prescriptions.  D/c education completed with patient/family including follow up instructions, medication list, d/c activities limitations if indicated, with other d/c instructions as indicated by MD - patient able to verbalize understanding, all questions fully answered.   Patient instructed to return to ED, call 911, or call MD for any changes in condition.   Patient escorted via WC, and D/C home via private auto.  Joellyn Haff Price 04/24/2017 4:23 PM

## 2017-04-24 NOTE — Discharge Summary (Signed)
DISCHARGE SUMMARY  Ruth Charles  MR#: [id number]  DOB:June 27, 1970  Date of Admission: 04/20/2017 Date of Discharge: 04/24/2017  Attending Physician:Tukker Byrns T  Patient's FHL:[id number], [name], MD  Consults: GI IR  PCCM  Disposition: D/C home   Follow-up Appts: Follow-up Information    Maurice Small, MD. Schedule an appointment as soon as possible for a visit in 5 day(s).   Specialty:  Family Medicine Contact information: [address] [place] [address] [phone]        [facility] INTERVENTIONAL RADIOLOGY. Schedule an appointment as soon as possible for a visit in 8 week(s).   Specialty:  Radiology Contact information: [address] [id number] Wilhemina Bonito [place] [address] [phone]          Tests Needing Follow-up: -recheck of CBC is suggested in 3-5 days  -recheck of K+ is suggested  -question pt on cutting back or abstaining from EtOH  -IVC filter will need to be removed as pt is approaching the end of her anticoag tx period (~8 weeks) - f/u in IR clinic   Discharge Diagnoses: Massive hematemesis/upper GI bleed Acute right lower extremity DVT Upper airway edema Chronic alcohol use Asthma Hypokalemia Hypophosphatemia  Initial presentation: 46 y.o.female with long-standing history of alcohol use who was diagnosed with DVT on 11/12 & started on Xarelto and subsequently developed the acute onset of massive hematemesis.  In the ED the patient was reporting pain in her throat and neck. Lateral film suggested supraglottic edema confirmed by ENT with endoscopy.  Given patient's potential for airway compromise she was admitted to the ICU.  Hospital Course: 11/12 - Diagnosed w/ extensive acute R LE DVT & started on Xarelto 11/19 - Admitwith GIB and upper airway edema  11/20 - IVC filter placement complete 11/21 - EGD  11/22 - TRH assumed care   Massive hematemesis / upper GI bleed >  esophagitis w/ ulcers  EGD noted esophagits w/ a few superficial ulcers - biopsies were taken - remainder of EGD unremarkable - cleared for diet per GI who suggested PPI and carafate BID for 2 weeks, then QD PPI indef thereafter while on anticoag   Acute right lower extremity DVT now has IVC filter - treated initially w/ heparin drip to ensure no rebleeding - tolerated well so returned to Xarelto dosing - plan to treat for DVT for 3-6 months &avoid hormone replacement therapy in the future  Upper airway edema resolved - secondary to recurrent emesis and associated edema   Chronic alcohol use CIWA protocol utilized but pt did not display sx c/w alcohol withdrawal   Asthma quiescent   Hypokalemia Likely due to total body deficit due to poor nutritonal status - plan to supplement daily for 7 days then stop   Hypophosphatemia Corrected w/ supplementation and resumption of diet     Allergies as of 04/24/2017      Reactions   Corn-containing Products Other (See Comments)   Chest congestion   Gluten Meal Other (See Comments)   Nausea and diarrhea   Lactose Intolerance (gi) Other (See Comments)   Chest congestion   Remicade [infliximab] Shortness Of Breath, Other (See Comments)   "Lightheadedness" also   Cosentyx [secukinumab] Other (See Comments)   Worsened symptoms   Enbrel [etanercept] Other (See Comments)   Psoriasis worsened   Humira [adalimumab] Other (See Comments)   Psoriasis worsened   Sulfa Antibiotics Hives   Tape Other (See Comments)   TEARS THE SKIN!! COBAN WRAP IS TOLERATED  Latex Rash      Medication List    STOP taking these medications   docusate sodium 100 MG capsule Commonly known as:  COLACE   ferrous sulfate 325 (65 FE) MG tablet   HYDROcodone-acetaminophen 7.5-325 MG tablet Commonly known as:  NORCO   JUNEL FE 1.5/30 1.5-30 MG-MCG tablet Generic drug:  norethindrone-ethinyl estradiol-iron   methocarbamol 500 MG tablet Commonly known  as:  ROBAXIN   omeprazole 20 MG capsule Commonly known as:  PRILOSEC   polyethylene glycol packet Commonly known as:  MIRALAX / GLYCOLAX   predniSONE 20 MG tablet Commonly known as:  DELTASONE     TAKE these medications   albuterol 108 (90 Base) MCG/ACT inhaler Commonly known as:  PROVENTIL HFA;VENTOLIN HFA Inhale 2 puffs into the lungs every 6 (six) hours as needed for wheezing or shortness of breath.   ALPRAZolam 0.25 MG tablet Commonly known as:  XANAX Take 0.25 mg by mouth 3 (three) times daily as needed for anxiety.   budesonide-formoterol 160-4.5 MCG/ACT inhaler Commonly known as:  SYMBICORT Inhale 2 puffs into the lungs 2 (two) times daily.   cetirizine 10 MG tablet Commonly known as:  ZYRTEC Take 10 mg by mouth daily.   diphenhydrAMINE 25 MG tablet Commonly known as:  BENADRYL Take 25 mg every 6 (six) hours as needed by mouth (for "back up" if Zyrtec is not effective).   furosemide 40 MG tablet Commonly known as:  LASIX Take 40 mg daily as needed by mouth (for obvious swelling of the ankles).   pantoprazole 40 MG tablet Commonly known as:  PROTONIX Take 1 tablet (40 mg total) by mouth 2 (two) times daily before a meal.   potassium chloride SA 20 MEQ tablet Commonly known as:  K-DUR,KLOR-CON Take 1 tablet (20 mEq total) by mouth daily. Start taking on:  04/25/2017   Rivaroxaban 15 & 20 MG Tbpk Take 15 mg 2 (two) times daily by mouth. Take as directed on package: Start with one 15mg  tablet by mouth twice a day with food. On Day 22, switch to one 20mg  tablet once a day with food. What changed:  additional instructions   sucralfate 1 GM/10ML suspension Commonly known as:  CARAFATE Take 10 mLs (1 g total) by mouth 2 (two) times daily.   traMADol 50 MG tablet Commonly known as:  ULTRAM Take 50 mg every 8 (eight) hours as needed by mouth (for pain).       Day of Discharge BP (!) 145/87 (BP Location: Right Arm)   Pulse 97   Temp 97.9 F (36.6 C) (Oral)    Resp 18   Ht 5\' 4"  (1.626 m)   Wt 73.2 kg (161 lb 6 oz)   LMP 04/20/2017   SpO2 100%   BMI 27.70 kg/m   Physical Exam: General: No acute respiratory distress Lungs: Clear to auscultation bilaterally without wheezes or crackles Cardiovascular: Regular rate and rhythm without murmur gallop or rub normal S1 and S2 Abdomen: Nontender, nondistended, soft, bowel sounds positive, no rebound, no ascites, no appreciable mass Extremities: No significant cyanosis, clubbing, or edema bilateral lower extremities  Basic Metabolic Panel: Recent Labs  Lab 04/20/17 0515 04/21/17 0514 04/22/17 0444 04/23/17 0223 04/24/17 0453  NA 138 135 135 134* 139  K 3.3* 3.4* 2.9* 3.3* 3.2*  CL 103 105 110 105 108  CO2 21* 20* 20* 21* 24  GLUCOSE 101* 115* 68 106* 76  BUN 6 5* 6 6 5*  CREATININE 0.83 0.73 0.64 0.64  0.72  CALCIUM 8.7* 7.7* 7.6* 7.8* 8.0*  MG  --  1.7 1.8 1.8 1.9  PHOS  --  1.9* 1.4* 2.2* 3.3    Liver Function Tests: Recent Labs  Lab 04/20/17 0515 04/21/17 0514 04/22/17 0444 04/23/17 0223 04/24/17 0453  AST 83* 43*  --   --  85*  ALT 29 25  --   --  52  ALKPHOS 66 52  --   --  48  BILITOT 1.8* 1.9*  --   --  1.5*  PROT 7.2 6.1*  --   --  5.4*  ALBUMIN 3.7 3.3* 3.0* 3.1* 2.9*   Recent Labs  Lab 04/20/17 0515  LIPASE 65*   Coags: Recent Labs  Lab 04/20/17 0515 04/22/17 0444  INR 1.69 1.10   CBC: Recent Labs  Lab 04/21/17 0748 04/21/17 1841 04/22/17 0444 04/22/17 1649 04/23/17 0223 04/23/17 1811 04/24/17 0453 04/24/17 1022  WBC 16.0* 13.8* 10.7* 10.5 8.8 9.7 6.5 7.8  NEUTROABS 15.3* 12.4* 8.8* 10.2* 7.6  --   --   --   HGB 10.3* 10.5* 10.4* 11.7* 11.0* 12.3 10.8* 11.5*  HCT 31.0* 31.1* 31.4* 34.5* 33.0* 36.1 32.6* 33.5*  MCV 116.1* 116.9* 117.6* 116.9* 116.6* 115.7* 115.2* 115.9*  PLT 284 279 258 226 215 208 176 196    CBG: Recent Labs  Lab 04/21/17 1944 04/22/17 0009 04/22/17 0416 04/22/17 0932 04/22/17 1538  GLUCAP 88 85 71 79 148*     Recent Results (from the past 240 hour(s))  MRSA PCR Screening     Status: None   Collection Time: 04/20/17  5:51 PM  Result Value Ref Range Status   MRSA by PCR NEGATIVE NEGATIVE Final    Comment:        The GeneXpert MRSA Assay (FDA approved for NASAL specimens only), is one component of a comprehensive MRSA colonization surveillance program. It is not intended to diagnose MRSA infection nor to guide or monitor treatment for MRSA infections.      Time spent in discharge (includes decision making & examination of pt): 35 minutes  04/24/2017, 2:44 PM   Lonia Blood, MD [facility] Office  [phone] Pager [phone]  On-Call/Text Page:      [email or web address]      password [facility]

## 2017-04-24 NOTE — Progress Notes (Signed)
Patient up walking around room, stated she felt fine and did not need a breathing treatment at this time. RT made pt aware that she had a PRN tx in and if she felt like she needed one later to call.

## 2017-04-24 NOTE — Progress Notes (Addendum)
ANTICOAGULATION CONSULT NOTE - Follow up Consult  Pharmacy Consult for heparin Indication: DVT  Patient Measurements: Height: 5\' 4"  (162.6 cm) Weight: 161 lb 6 oz (73.2 kg) IBW/kg (Calculated) : 54.7 Heparin Dosing Weight: 69kg  Labs: Recent Labs    04/22/17 0444  04/23/17 0223 04/23/17 1811 04/24/17 0453  HGB 10.4*   < > 11.0* 12.3 10.8*  HCT 31.4*   < > 33.0* 36.1 32.6*  PLT 258   < > 215 208 176  APTT 24  --   --  41*  --   LABPROT 14.1  --   --   --   --   INR 1.10  --   --   --   --   HEPARINUNFRC  --   --   --  0.24* 0.43  CREATININE 0.64  --  0.64  --  0.72   < > = values in this interval not displayed.   Assessment: 64 yof initially presented with hematemesis after recently starting xarelto for acute DVT. Xarelto held and IVC filter placed. No active bleeding on EGD so resuming anticoagulation. To start with IV heparin with plans to resume oral anticoagulation if no re-bleeding.   Slight drop in Hemoglobin and platelets this morning at 10.8 and 176 respectively. Heparin level was therapeutic on morning labs, will obtain a repeat level. Continue at current rate and monitor for bleeding.  *Addendum* -Transition back to Xarelto for management of her DVT. Will start the Xarelto when the heparin is turned off. She will receive the remainder of her BID dosing for the treatment of her DVT until 12/7, then will proceed with 20mg  daily.  Goal of Therapy:  Heparin level 0.3-0.7 units/ml  Monitor platelets by anticoagulation protocol: Yes   Plan:  Stop heparin and start Xarelto at time of heparin discontinuation Xarelto 15mg  BID through 12/7 then Xarelto 20 mg daily   Thank you for allowing to participate in this patients care.  PharmD PGY1 Pharmacy Practice Resident 04/24/2017 9:05 AM Phone: [phone]

## 2017-05-04 ENCOUNTER — Other Ambulatory Visit (HOSPITAL_COMMUNITY): Payer: Self-pay | Admitting: Interventional Radiology

## 2017-05-04 DIAGNOSIS — Z86718 Personal history of other venous thrombosis and embolism: Secondary | ICD-10-CM

## 2017-05-04 DIAGNOSIS — Z8759 Personal history of other complications of pregnancy, childbirth and the puerperium: Principal | ICD-10-CM

## 2017-06-25 ENCOUNTER — Other Ambulatory Visit (HOSPITAL_COMMUNITY): Payer: Self-pay | Admitting: Interventional Radiology

## 2017-06-25 ENCOUNTER — Encounter: Payer: Self-pay | Admitting: Radiology

## 2017-06-25 ENCOUNTER — Ambulatory Visit
Admission: RE | Admit: 2017-06-25 | Discharge: 2017-06-25 | Disposition: A | Discharge status: Home / Self Care | Payer: Managed Care, Other (non HMO) | Source: Ambulatory Visit | Attending: Interventional Radiology | Admitting: Interventional Radiology

## 2017-06-25 DIAGNOSIS — Z86718 Personal history of other venous thrombosis and embolism: Secondary | ICD-10-CM

## 2017-06-25 DIAGNOSIS — I82401 Acute embolism and thrombosis of unspecified deep veins of right lower extremity: Secondary | ICD-10-CM

## 2017-06-25 HISTORY — PX: IR RADIOLOGIST EVAL & MGMT: IMG5224

## 2017-06-25 NOTE — Progress Notes (Signed)
Chief Complaint: The patient is seen in follow up today s/p RLE DVT  History of present illness: Ruth Charles is a 47 year old female with past medical history of arthritis, asthma, GERD, hormonal birth control, and alcohol abuse who developed an acute DVT in November 2018. She was prescribed Xarelto at home, but subsequently was admitted to [facility] with hematemesis. An IVC filter was placed 11/21 as oral anticoagulation was suspended. Endoscopy was performed by GI who identified gastritis and several peptic ulcers and recommended that patient resume oral anticoagulation with PPI coverage. She presents to radiology clinic today for follow-up of her retrievable IVC filter. Since discharge the patient has resumed oral anticoagulation and stopped oral contraceptives.  She has now been on Xarelto for approximately 2 months with tolerance and has seen improvement in her right lower leg swelling.  She has been seen in follow-up with her rheumatologist and PCP who are assisting and directing care.   Past Medical History:  Diagnosis Date  . Arthritis   . Asthma   . Bronchitis    hx of   . Complication of anesthesia   . Cough   . Diarrhea   . Fatigue   . Frequent urination at night   . GERD (gastroesophageal reflux disease)   . Gluten intolerance   . Lactose intolerance   . Mumps    hx of   . Night sweats   . PONV (postoperative nausea and vomiting)   . Seasonal allergies   . Shortness of breath dyspnea   . Streptococcal infection    hx of   . Tinnitus   . Wheezing     Past Surgical History:  Procedure Laterality Date  . APPENDECTOMY     2004  . BREAST SURGERY     breast biopsy left   . ESOPHAGOGASTRODUODENOSCOPY N/A 04/22/2017   Procedure: ESOPHAGOGASTRODUODENOSCOPY (EGD);  Surgeon: Kerin Salen, MD;  Location: [facility] ENDOSCOPY;  Service: Gastroenterology;  Laterality: N/A;  . IR IVC FILTER PLMT / S&I /IMG GUID/MOD SED  04/22/2017  . PATELLA-FEMORAL ARTHROPLASTY Left 11/06/2014   Procedure: LEFT KNEE PATELLA-FEMORAL ARTHROPLASTY;  Surgeon: Durene Romans, MD;  Location: [facility] ORS;  Service: Orthopedics;  Laterality: Left;  . removal right ovary with cyst     2004  . right TMJ     2002    Allergies: Corn-containing products; Gluten meal; Lactose intolerance (gi); Remicade [infliximab]; Cosentyx [secukinumab]; Enbrel [etanercept]; Humira [adalimumab]; Sulfa antibiotics; Tape; and Latex  Medications: Prior to Admission medications   Medication Sig Start Date End Date Taking? Authorizing Provider  albuterol (PROVENTIL HFA;VENTOLIN HFA) 108 (90 BASE) MCG/ACT inhaler Inhale 2 puffs into the lungs every 6 (six) hours as needed for wheezing or shortness of breath.    Yes [provider]  ALPRAZolam (XANAX) 0.25 MG tablet Take 0.25 mg by mouth 3 (three) times daily as needed for anxiety.   Yes [provider]  budesonide-formoterol (SYMBICORT) 160-4.5 MCG/ACT inhaler Inhale 2 puffs into the lungs 2 (two) times daily.   Yes [provider]  cetirizine (ZYRTEC) 10 MG tablet Take 10 mg by mouth daily.   Yes [provider]  diphenhydrAMINE (BENADRYL) 25 MG tablet Take 25 mg every 6 (six) hours as needed by mouth (for "back up" if Zyrtec is not effective).    Yes [provider]  fluticasone (FLONASE) 50 MCG/ACT nasal spray Place 1 spray into both nostrils daily.   Yes [provider]  furosemide (LASIX) 40 MG tablet Take 40 mg  daily as needed by mouth (for obvious swelling of the ankles).   Yes [provider]  pantoprazole (PROTONIX) 40 MG tablet Take 1 tablet (40 mg total) by mouth 2 (two) times daily before a meal. 04/24/17  Yes Lonia Blood, MD  potassium chloride SA (K-DUR,KLOR-CON) 20 MEQ tablet Take 1 tablet (20 mEq total) by mouth daily. 04/25/17  Yes Lonia Blood, MD  Rivaroxaban 15 & 20 MG TBPK Take 15 mg 2 (two) times daily by mouth. Take as directed on package: Start with one 15mg  tablet by mouth  twice a day with food. On Day 22, switch to one 20mg  tablet once a day with food. Patient taking differently: Take 15 mg 2 (two) times daily by mouth. Take as directed on package: Start with one 15mg  tablet by mouth twice a day with food. On Day 22, switch to one 20mg  tablet once a day with food.  ( on day 6 of therapy) 04/13/17  Yes , MD  sucralfate (CARAFATE) 1 GM/10ML suspension Take 10 mLs (1 g total) by mouth 2 (two) times daily. 04/24/17  Yes , MD  traMADol (ULTRAM) 50 MG tablet Take 50 mg every 8 (eight) hours as needed by mouth (for pain).    Yes [provider]     No family history on file.  Social History   Socioeconomic History  . Marital status: Married    Spouse name: Not on file  . Number of children: Not on file  . Years of education: Not on file  . Highest education level: Not on file  Social Needs  . Financial resource strain: Not on file  . Food insecurity - worry: Not on file  . Food insecurity - inability: Not on file  . Transportation needs - medical: Not on file  . Transportation needs - non-medical: Not on file  Occupational History  . Not on file  Tobacco Use  . Smoking status: Never Smoker  . Smokeless tobacco: Never Used  Substance and Sexual Activity  . Alcohol use: Yes    Comment: wine and liquor 8-14 glasses per week   . Drug use: No  . Sexual activity: Not on file  Other Topics Concern  . Not on file  Social History Narrative  . Not on file     Vital Signs: BP 120/75   Pulse 96   Temp 98.4 F (36.9 C) (Oral)   Resp 14   Ht 5\' 4"  (1.626 m)   Wt 164 lb (74.4 kg)   SpO2 100%   BMI 28.15 kg/m   Physical Exam  Constitutional: She is oriented to person, place, and time. She appears well-developed.  Cardiovascular: Normal rate, regular rhythm and normal heart sounds.  Pulmonary/Chest: Effort normal and breath sounds normal. No respiratory distress.  Abdominal: Soft.  Musculoskeletal: Normal range of  motion. She exhibits edema (mild symmetrical bilateral edema just above ankles).  Neurological: She is alert and oriented to person, place, and time.  Skin: Skin is warm and dry.  Nursing note and vitals reviewed.    Imaging: No results found.  Labs:  CBC: Recent Labs    04/23/17 0223 04/23/17 1811 04/24/17 0453 04/24/17 1022  WBC 8.8 9.7 6.5 7.8  HGB 11.0* 12.3 10.8* 11.5*  HCT 33.0* 36.1 32.6* 33.5*  PLT 215 208 176 196    COAGS: Recent Labs    04/20/17 0515 04/22/17 0444 04/23/17 1811  INR 1.69 1.10  --   APTT 33  24 41*    BMP: Recent Labs    04/21/17 0514 04/22/17 0444 04/23/17 0223 04/24/17 0453  NA 135 135 134* 139  K 3.4* 2.9* 3.3* 3.2*  CL 105 110 105 108  CO2 20* 20* 21* 24  GLUCOSE 115* 68 106* 76  BUN 5* 6 6 5*  CALCIUM 7.7* 7.6* 7.8* 8.0*  CREATININE 0.73 0.64 0.64 0.72  GFRNONAA >60 >60 >60 >60  GFRAA >60 >60 >60 >60    LIVER FUNCTION TESTS: Recent Labs    04/20/17 0515 04/21/17 0514 04/22/17 0444 04/23/17 0223 04/24/17 0453  BILITOT 1.8* 1.9*  --   --  1.5*  AST 83* 43*  --   --  85*  ALT 29 25  --   --  52  ALKPHOS 66 52  --   --  48  PROT 7.2 6.1*  --   --  5.4*  ALBUMIN 3.7 3.3* 3.0* 3.1* 2.9*    Assessment: RLE DVT s/p IVC filter placement 11/21 Patient presents to radiology clinic today for follow-up of IVC filter placement.  [place] Lower Extremity performed today; results dictated separately. RLE DVT still present.  She has been able to resume oral anticoagulation and has been tolerating well.  Discussed risk factors for DVT and her current management strategy with patient and Dr. Archer Asa.  Plan is made for IVC filter retrieval at [facility] or [facility]. Patient will hear from schedulers with date and time of appointment.    Signed: Hoyt Koch, PA 06/25/2017, 8:26 AM   Please refer to Dr. Archer Asa attestation of this note for management and plan.

## 2017-07-17 ENCOUNTER — Other Ambulatory Visit: Payer: Self-pay | Admitting: Radiology

## 2017-07-21 ENCOUNTER — Encounter (HOSPITAL_COMMUNITY): Payer: Self-pay

## 2017-07-21 ENCOUNTER — Ambulatory Visit (HOSPITAL_COMMUNITY)
Admission: RE | Admit: 2017-07-21 | Discharge: 2017-07-21 | Disposition: A | Discharge status: Home / Self Care | Payer: Managed Care, Other (non HMO) | Source: Ambulatory Visit | Attending: Interventional Radiology | Admitting: Interventional Radiology

## 2017-07-21 DIAGNOSIS — K219 Gastro-esophageal reflux disease without esophagitis: Secondary | ICD-10-CM | POA: Diagnosis not present

## 2017-07-21 DIAGNOSIS — Z7951 Long term (current) use of inhaled steroids: Secondary | ICD-10-CM | POA: Insufficient documentation

## 2017-07-21 DIAGNOSIS — Z7901 Long term (current) use of anticoagulants: Secondary | ICD-10-CM | POA: Insufficient documentation

## 2017-07-21 DIAGNOSIS — I82401 Acute embolism and thrombosis of unspecified deep veins of right lower extremity: Secondary | ICD-10-CM

## 2017-07-21 DIAGNOSIS — Z4689 Encounter for fitting and adjustment of other specified devices: Secondary | ICD-10-CM | POA: Insufficient documentation

## 2017-07-21 DIAGNOSIS — Z86718 Personal history of other venous thrombosis and embolism: Secondary | ICD-10-CM | POA: Insufficient documentation

## 2017-07-21 DIAGNOSIS — M1990 Unspecified osteoarthritis, unspecified site: Secondary | ICD-10-CM | POA: Insufficient documentation

## 2017-07-21 DIAGNOSIS — E739 Lactose intolerance, unspecified: Secondary | ICD-10-CM | POA: Insufficient documentation

## 2017-07-21 DIAGNOSIS — Z882 Allergy status to sulfonamides status: Secondary | ICD-10-CM | POA: Insufficient documentation

## 2017-07-21 DIAGNOSIS — J45909 Unspecified asthma, uncomplicated: Secondary | ICD-10-CM | POA: Diagnosis not present

## 2017-07-21 DIAGNOSIS — Z91040 Latex allergy status: Secondary | ICD-10-CM | POA: Diagnosis not present

## 2017-07-21 HISTORY — PX: IR IVC FILTER RETRIEVAL / S&I /IMG GUID/MOD SED: IMG5308

## 2017-07-21 LAB — CBC
HEMATOCRIT: 45.8 % (ref 36.0–46.0)
Hemoglobin: 15.4 g/dL — ABNORMAL HIGH (ref 12.0–15.0)
MCH: 31.6 pg (ref 26.0–34.0)
MCHC: 33.6 g/dL (ref 30.0–36.0)
MCV: 94 fL (ref 78.0–100.0)
PLATELETS: 233 10*3/uL (ref 150–400)
RBC: 4.87 MIL/uL (ref 3.87–5.11)
RDW: 17.5 % — AB (ref 11.5–15.5)
WBC: 6.4 10*3/uL (ref 4.0–10.5)

## 2017-07-21 LAB — PROTIME-INR
INR: 1.09
PROTHROMBIN TIME: 14 s (ref 11.4–15.2)

## 2017-07-21 LAB — BASIC METABOLIC PANEL
Anion gap: 14 (ref 5–15)
BUN: 12 mg/dL (ref 6–20)
CALCIUM: 9.6 mg/dL (ref 8.9–10.3)
CO2: 23 mmol/L (ref 22–32)
CREATININE: 0.86 mg/dL (ref 0.44–1.00)
Chloride: 104 mmol/L (ref 101–111)
GFR calc Af Amer: >60 mL/min (ref >60)
GLUCOSE: 87 mg/dL (ref 65–99)
POTASSIUM: 3.6 mmol/L (ref 3.5–5.1)
SODIUM: 141 mmol/L (ref 135–145)

## 2017-07-21 MED ORDER — MIDAZOLAM HCL 2 MG/2ML IJ SOLN
INTRAMUSCULAR | Status: AC
  Filled 2017-07-21: qty 4

## 2017-07-21 MED ORDER — LIDOCAINE HCL 1 % IJ SOLN
INTRAMUSCULAR | Status: AC | PRN
  Administered 2017-07-21: 10 mL

## 2017-07-21 MED ORDER — FENTANYL CITRATE (PF) 100 MCG/2ML IJ SOLN
INTRAMUSCULAR | Status: AC | PRN
  Administered 2017-07-21: 50 ug via INTRAVENOUS
  Administered 2017-07-21: 25 ug via INTRAVENOUS

## 2017-07-21 MED ORDER — MIDAZOLAM HCL 2 MG/2ML IJ SOLN
INTRAMUSCULAR | Status: AC | PRN
  Administered 2017-07-21 (×2): 1 mg via INTRAVENOUS

## 2017-07-21 MED ORDER — LIDOCAINE HCL 1 % IJ SOLN
INTRAMUSCULAR | Status: AC
  Filled 2017-07-21: qty 20

## 2017-07-21 MED ORDER — IOPAMIDOL (ISOVUE-300) INJECTION 61%
INTRAVENOUS | Status: AC
  Administered 2017-07-21: 30 mL
  Filled 2017-07-21: qty 100

## 2017-07-21 MED ORDER — FENTANYL CITRATE (PF) 100 MCG/2ML IJ SOLN
INTRAMUSCULAR | Status: AC
  Filled 2017-07-21: qty 4

## 2017-07-21 MED ORDER — SODIUM CHLORIDE 0.9 % IV SOLN
INTRAVENOUS | Status: DC
  Administered 2017-07-21: 10:00:00 via INTRAVENOUS

## 2017-07-21 NOTE — H&P (Signed)
Chief Complaint: Patient was seen in consultation today for Inferior vena cava filter retrieval    Supervising Physician: Malachy Moan  Patient Status: [facility] - Out-pt  History of Present Illness: Ruth Charles is a 47 y.o. female   IVC filter placed 04/22/17 RLE DVT and placed on Xarelto Developed hematemesis Stopped anticoagulation and IVC filter was placed Resumed sometime later with PPI Now continued Xarelto without issue 06/25/17 [place]: IMPRESSION: 1. Significant interval improvement in the volume of DVT in the right lower extremity compared to 04/13/2017. The right common femoral vein and the femoral vein in the proximal thigh are now patent. There is persistent subacute/chronic thrombus throughout the remainder of the femoral vein in the mid and distal thigh as well as within the popliteal vein. 2. No evidence of DVT in the left lower extremity.  Scheduled now for IVC filter retrieval   Past Medical History:  Diagnosis Date  . Arthritis   . Asthma   . Bronchitis    hx of   . Complication of anesthesia   . Cough   . Diarrhea   . Fatigue   . Frequent urination at night   . GERD (gastroesophageal reflux disease)   . Gluten intolerance   . Lactose intolerance   . Mumps    hx of   . Night sweats   . PONV (postoperative nausea and vomiting)   . Seasonal allergies   . Shortness of breath dyspnea   . Streptococcal infection    hx of   . Tinnitus   . Wheezing     Past Surgical History:  Procedure Laterality Date  . APPENDECTOMY     2004  . BREAST SURGERY     breast biopsy left   . ESOPHAGOGASTRODUODENOSCOPY N/A 04/22/2017   Procedure: ESOPHAGOGASTRODUODENOSCOPY (EGD);  Surgeon: Kerin Salen, MD;  Location: [facility] ENDOSCOPY;  Service: Gastroenterology;  Laterality: N/A;  . IR IVC FILTER PLMT / S&I /IMG GUID/MOD SED  04/22/2017  . IR RADIOLOGIST EVAL & MGMT  06/25/2017  . PATELLA-FEMORAL ARTHROPLASTY Left 11/06/2014   Procedure: LEFT KNEE PATELLA-FEMORAL  ARTHROPLASTY;  Surgeon: Durene Romans, MD;  Location: [facility] ORS;  Service: Orthopedics;  Laterality: Left;  . removal right ovary with cyst     2004  . right TMJ     2002    Allergies: Corn-containing products; Gluten meal; Lactose intolerance (gi); Remicade [infliximab]; Cosentyx [secukinumab]; Enbrel [etanercept]; Humira [adalimumab]; Sulfa antibiotics; Tape; and Latex  Medications: Prior to Admission medications   Medication Sig Start Date End Date Taking? Authorizing Provider  albuterol (PROVENTIL HFA;VENTOLIN HFA) 108 (90 BASE) MCG/ACT inhaler Inhale 2 puffs into the lungs every 6 (six) hours as needed for wheezing or shortness of breath.    Yes [provider]  ALPRAZolam (XANAX) 0.25 MG tablet Take 0.25 mg by mouth 3 (three) times daily as needed for anxiety.   Yes [provider]  budesonide-formoterol (SYMBICORT) 160-4.5 MCG/ACT inhaler Inhale 2 puffs into the lungs 2 (two) times daily.   Yes [provider]  cetirizine (ZYRTEC) 10 MG tablet Take 10 mg by mouth daily.   Yes [provider]  desonide (DESOWEN) 0.05 % cream Apply 1 application topically 2 (two) times daily as needed (for psoriasis).   Yes [provider]  diphenhydrAMINE (BENADRYL) 25 MG tablet Take 25 mg every 6 (six) hours as needed by mouth (for "back up" if Zyrtec is not effective).    Yes [provider]  fluticasone (FLONASE) 50 MCG/ACT nasal spray  Place 1 spray into both nostrils daily.   Yes [provider]  furosemide (LASIX) 40 MG tablet Take 40 mg daily as needed by mouth (for obvious swelling of the ankles).   Yes [provider]  pantoprazole (PROTONIX) 40 MG tablet Take 1 tablet (40 mg total) by mouth 2 (two) times daily before a meal. 04/24/17  Yes Lonia Blood, MD  potassium chloride SA (K-DUR,KLOR-CON) 20 MEQ tablet Take 1 tablet (20 mEq total) by mouth daily. Patient taking differently: Take 20 mEq by mouth daily as needed (with  Lasix).  04/25/17  Yes Lonia Blood, MD  Rivaroxaban 15 & 20 MG TBPK Take 15 mg 2 (two) times daily by mouth. Take as directed on package: Start with one 15mg  tablet by mouth twice a day with food. On Day 22, switch to one 20mg  tablet once a day with food. Patient taking differently: Take 20 mg by mouth daily.  04/13/17  Yes Lamont Snowball, MD  sucralfate (CARAFATE) 1 GM/10ML suspension Take 10 mLs (1 g total) by mouth 2 (two) times daily. 04/24/17  Yes Lonia Blood, MD  traMADol (ULTRAM) 50 MG tablet Take 50 mg by mouth every 8 (eight) hours as needed for moderate pain.    Yes [provider]  triamcinolone ointment (KENALOG) 0.1 % Apply 1 application topically 2 (two) times daily as needed (for psoriasis).   Yes [provider]  ustekinumab (STELARA) 90 MG/ML SOSY injection Inject 90 mg into the skin every 3 (three) months.   Yes [provider]     History reviewed. No pertinent family history.  Social History   Socioeconomic History  . Marital status: Married    Spouse name: None  . Number of children: None  . Years of education: None  . Highest education level: None  Social Needs  . Financial resource strain: None  . Food insecurity - worry: None  . Food insecurity - inability: None  . Transportation needs - medical: None  . Transportation needs - non-medical: None  Occupational History  . None  Tobacco Use  . Smoking status: Never Smoker  . Smokeless tobacco: Never Used  Substance and Sexual Activity  . Alcohol use: Yes    Comment: wine and liquor 8-14 glasses per week   . Drug use: No  . Sexual activity: None  Other Topics Concern  . None  Social History Narrative  . None    Review of Systems: A 12 point ROS discussed and pertinent positives are indicated in the HPI above.  All other systems are negative.  Review of Systems  Constitutional: Negative for activity change, fatigue and fever.  HENT: Negative for facial swelling.     Respiratory: Negative for cough and shortness of breath.   Gastrointestinal: Negative for abdominal pain.  Neurological: Negative for weakness.  Psychiatric/Behavioral: Negative for behavioral problems and confusion.    Vital Signs: BP (!) 142/68 (BP Location: Right Arm)   Pulse 71   Temp 98 F (36.7 C) (Oral)   Ht 5\' 4"  (1.626 m)   Wt 160 lb (72.6 kg)   SpO2 100%   BMI 27.46 kg/m   Physical Exam  Constitutional: She is oriented to person, place, and time.  Cardiovascular: Normal rate, regular rhythm and normal heart sounds.  Pulmonary/Chest: Effort normal and breath sounds normal.  Abdominal: Soft. Bowel sounds are normal.  Musculoskeletal: Normal range of motion.  Neurological: She is alert and oriented to person, place, and time.  Skin:  Skin is warm and dry.  Psychiatric: She has a normal mood and affect. Her behavior is normal. Judgment and thought content normal.  Nursing note and vitals reviewed.   Imaging: US Venous Img Lower Bilateral  Result Date: 06/25/2017 CLINICAL DATA:  47 year old female with a history of right lower extremity DVT in November 2018. Follow-up evaluation. Patient has an IVC filter in place and is currently on anticoagulation. EXAM: BILATERAL LOWER EXTREMITY VENOUS DOPPLER ULTRASOUND TECHNIQUE: Gray-scale sonography with graded compression, as well as color Doppler and duplex ultrasound were performed to evaluate the lower extremity deep venous systems from the level of the common femoral vein and including the common femoral, femoral, profunda femoral, popliteal and calf veins including the posterior tibial, peroneal and gastrocnemius veins when visible. The superficial great saphenous vein was also interrogated. Spectral Doppler was utilized to evaluate flow at rest and with distal augmentation maneuvers in the common femoral, femoral and popliteal veins. COMPARISON:  Prior duplex venous ultrasound of the right lower extremity 04/13/2017 FINDINGS: RIGHT  LOWER EXTREMITY Common Femoral Vein: No evidence of thrombus. Normal compressibility, respiratory phasicity and response to augmentation. Saphenofemoral Junction: No evidence of thrombus. Normal compressibility and flow on color Doppler imaging. Profunda Femoral Vein: No evidence of thrombus. Normal compressibility and flow on color Doppler imaging. Femoral Vein: The femoral vein is patent. However, beginning in the and compressible in the proximal thigh proximal to mid thigh, the vessel becomes un compressible and the lumen is slightly expanded with internal echogenic material. There is a prominent collateral in the deep aspect of the thigh. Popliteal Vein: Chronic thrombus extends through the popliteal vein. Some developing recanalization with peripheral flow on color Doppler imaging. Calf Veins: No evidence of thrombus. Normal compressibility and flow on color Doppler imaging. Superficial Great Saphenous Vein: No evidence of thrombus. Normal compressibility. Venous Reflux:  None. Other Findings:  None. LEFT LOWER EXTREMITY Common Femoral Vein: No evidence of thrombus. Normal compressibility, respiratory phasicity and response to augmentation. Saphenofemoral Junction: No evidence of thrombus. Normal compressibility and flow on color Doppler imaging. Profunda Femoral Vein: No evidence of thrombus. Normal compressibility and flow on color Doppler imaging. Femoral Vein: No evidence of thrombus. Normal compressibility, respiratory phasicity and response to augmentation. Popliteal Vein: No evidence of thrombus. Normal compressibility, respiratory phasicity and response to augmentation. Calf Veins: No evidence of thrombus. Normal compressibility and flow on color Doppler imaging. Superficial Great Saphenous Vein: No evidence of thrombus. Normal compressibility. Venous Reflux:  None. Other Findings:  None. IMPRESSION: 1. Significant interval improvement in the volume of DVT in the right lower extremity compared to  04/13/2017. The right common femoral vein and the femoral vein in the proximal thigh are now patent. There is persistent subacute/chronic thrombus throughout the remainder of the femoral vein in the mid and distal thigh as well as within the popliteal vein. 2. No evidence of DVT in the left lower extremity. Signed, Sterling Big, MD [facility] Electronically Signed   By: Malachy Moan M.D.   On: 06/25/2017 10:16   Ir Radiologist Eval & Mgmt  Result Date: 06/25/2017 Please refer to notes tab for details about interventional procedure. (Op Note)   Labs:  CBC: Recent Labs    04/23/17 1811 04/24/17 0453 04/24/17 1022 07/21/17 0836  WBC 9.7 6.5 7.8 6.4  HGB 12.3 10.8* 11.5* 15.4*  HCT 36.1 32.6* 33.5* 45.8  PLT 208 176 196 233    COAGS: Recent Labs    04/20/17  4818 04/22/17 0444 04/23/17 1811 07/21/17 0836  INR 1.69 1.10  --  1.09  APTT 33 24 41*  --     BMP: Recent Labs    04/21/17 0514 04/22/17 0444 04/23/17 0223 04/24/17 0453  NA 135 135 134* 139  K 3.4* 2.9* 3.3* 3.2*  CL 105 110 105 108  CO2 20* 20* 21* 24  GLUCOSE 115* 68 106* 76  BUN 5* 6 6 5*  CALCIUM 7.7* 7.6* 7.8* 8.0*  CREATININE 0.73 0.64 0.64 0.72  GFRNONAA >60 >60 >60 >60  GFRAA >60 >60 >60 >60    LIVER FUNCTION TESTS: Recent Labs    04/20/17 0515 04/21/17 0514 04/22/17 0444 04/23/17 0223 04/24/17 0453  BILITOT 1.8* 1.9*  --   --  1.5*  AST 83* 43*  --   --  85*  ALT 29 25  --   --  52  ALKPHOS 66 52  --   --  48  PROT 7.2 6.1*  --   --  5.4*  ALBUMIN 3.7 3.3* 3.0* 3.1* 2.9*    TUMOR MARKERS: No results for input(s): AFPTM, CEA, CA199, CHROMGRNA in the last 8760 hours.  Assessment and Plan:  Inferior vena cava filter retrieval today On Xarelto without issue Pt is aware of procedure benefits and risks including but not limited to Infection; bleeding; vessel damage Consent signed andin chart  Thank you for this  interesting consult.  I greatly enjoyed meeting MERLINE PERKIN and look forward to participating in their care.  A copy of this report was sent to the requesting provider on this date.  Electronically Signed: Robet Leu, PA-C 07/21/2017, 9:24 AM   I spent a total of  30 Minutes   in face to face in clinical consultation, greater than 50% of which was counseling/coordinating care for IVC filter retrieval

## 2017-07-21 NOTE — Procedures (Signed)
Interventional Radiology Procedure Note  Procedure: IVC filter retrieval  Complications: None  Estimated Blood Loss: None  Recommendations: - Bedrest with HOB elevated for 2 hrs - DC home   Signed,  Sterling Big, MD

## 2017-07-21 NOTE — Discharge Instructions (Signed)
Inferior Vena Cava Filter, Care After This sheet gives you information about how to care for yourself after your procedure. Your health care provider may also give you more specific instructions. If you have problems or questions, contact your health care provider. What can I expect after the procedure? After your procedure, it is common to have:  Mild pain in the area where the filter was inserted.  Mild bruising in the area where the filter was inserted.  Follow these instructions at home: Insertion site care  Follow instructions from your health care provider about how to take care of the site where a catheter was inserted at your neck or groin (insertion site). Make sure you: ? Wash your hands with soap and water before you change your bandage (dressing). If soap and water are not available, use hand sanitizer. ? Change your dressing as told by your health care provider.  Check your insertion site every day for signs of infection. Check for: ? More redness, swelling, or pain. ? More fluid or blood. ? Warmth. ? Pus or a bad smell.  Keep the insertion site clean and dry.  Do not shower, bathe, use a hot tub, or let the dressing get wet until your health care provider approves. General instructions  Take over-the-counter and prescription medicines only as told by your health care provider.  Avoid heavy lifting or hard activities for 48 hours after the procedure or as told by your health care provider.  Do not drive for 24 hours if you were given a a medicine to help you relax (sedative).  Do not drive or use heavy machinery while taking prescription pain medicine.  Do not go back to school or work until your health care provider approves.  Keep all follow-up visits as told by your health care provider. This is important. Contact a health care provider if:  You have more redness, swelling, or pain around your insertion site.  You have more fluid or blood coming from your  insertion site.  Your insertion site feels warm to the touch.  You have pus or a bad smell coming from your insertion site.  You have a fever.  You are dizzy.  You have nausea and vomiting.  You develop a rash. Get help right away if:  You develop chest pain, a cough, or difficulty breathing.  You develop shortness of breath, feel faint, or pass out.  You cough up blood.  You have severe pain in your abdomen.  You develop swelling and discoloration or pain in your legs.  Your legs become pale and cold or blue.  You develop weakness, difficulty moving your arms or legs, or balance problems.  You develop problems with speech or vision. These symptoms may represent a serious problem that is an emergency. Do not wait to see if the symptoms will go away. Get medical help right away. Call your local emergency services (911 in the [place] Do not drive yourself to the hospital. Summary  After your insertion procedure, it is common to have mild pain and bruising.  Do not shower, bathe, use a hot tub, or let the dressing get wet until your health care provider approves.  Every day, check for signs of infection where a catheter was inserted at your neck or groin (insertion site). This information is not intended to replace advice given to you by your health care provider. Make sure you discuss any questions you have with your health care provider. Document Released: 03/09/2013 Document Revised:  04/09/2016 Document Reviewed: 04/09/2016 °Elsevier Interactive Patient Education © 2017 [employer] ° °

## 2019-06-12 IMAGING — XA IR IVC FILTER PLMT / S&I /IMG GUID/MOD SED
1 series · 14 of 18 positions shown · IV contrast (IODINE)
Comparison: none

INDICATION: 46-year-old female with extensive acute right lower extremity DVT.
Patient was started on anticoagulation and developed hematemesis.
Anticoagulation was stopped. Temporary caval interruption with a
potentially retrievable IVC filter is warranted until the etiology
of the hematemesis is determined. Patient may be able to resume
anticoagulation. Risk factors for DVT include use of hormonal over
the counter prophylaxis.

[Series 300: dsa body · 14 of 18 slices shown]
[im 1/18]
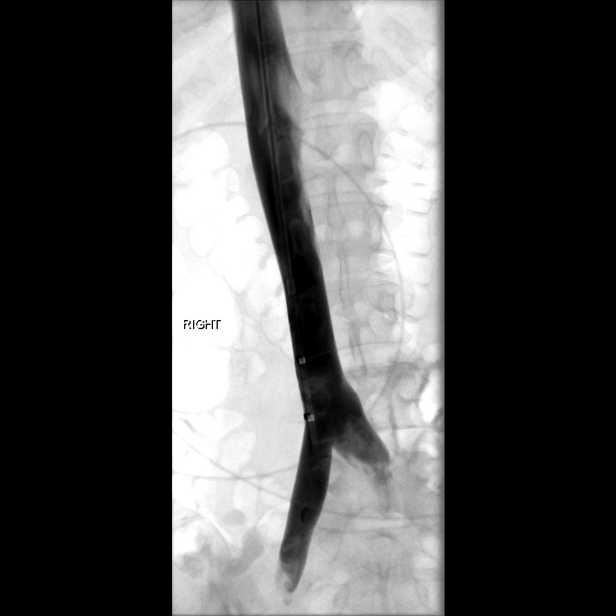
[im 2/18]
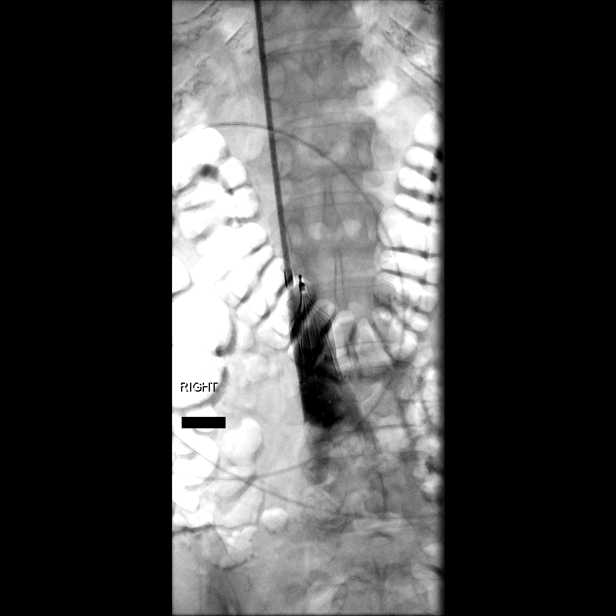
[im 4/18]
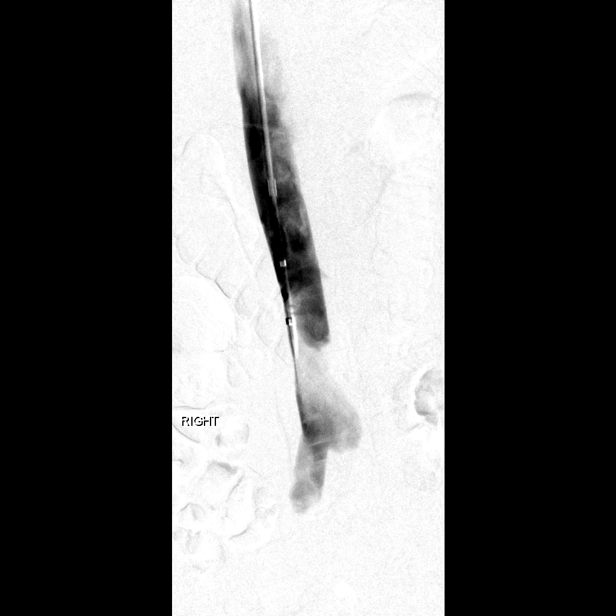
[im 5/18]
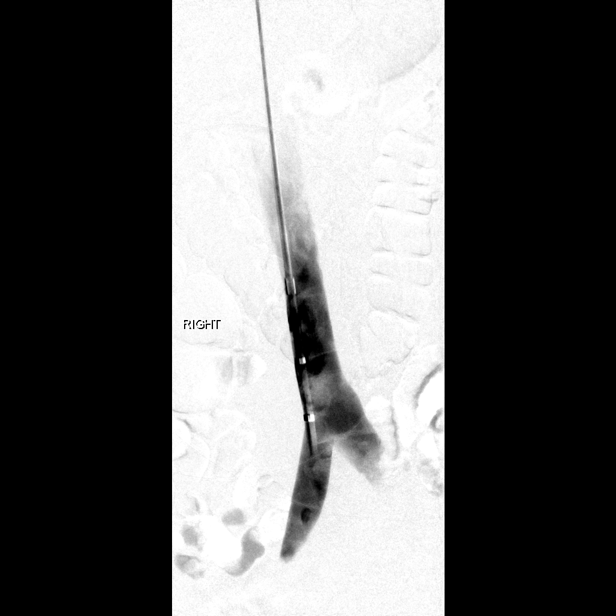
[im 6/18]
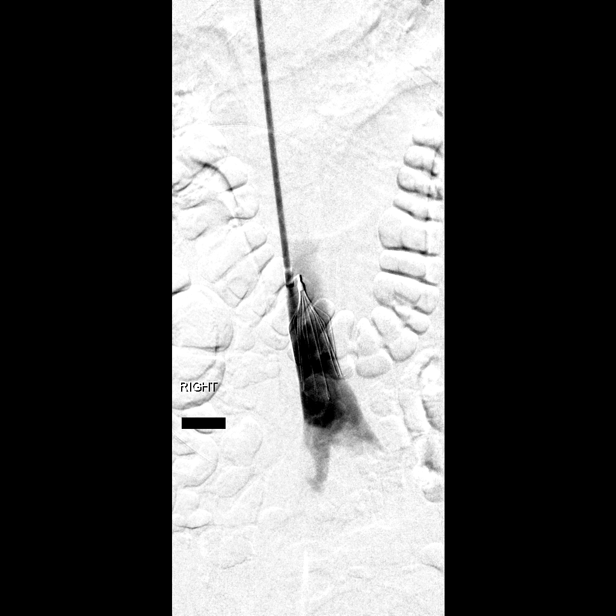
[im 8/18]
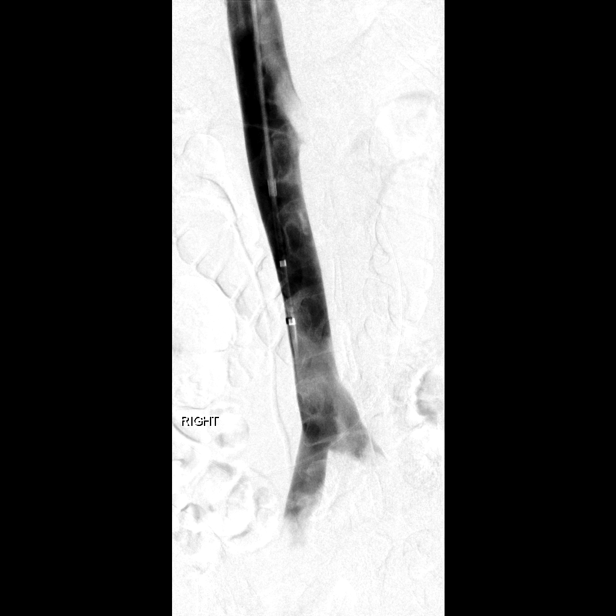
[im 9/18]
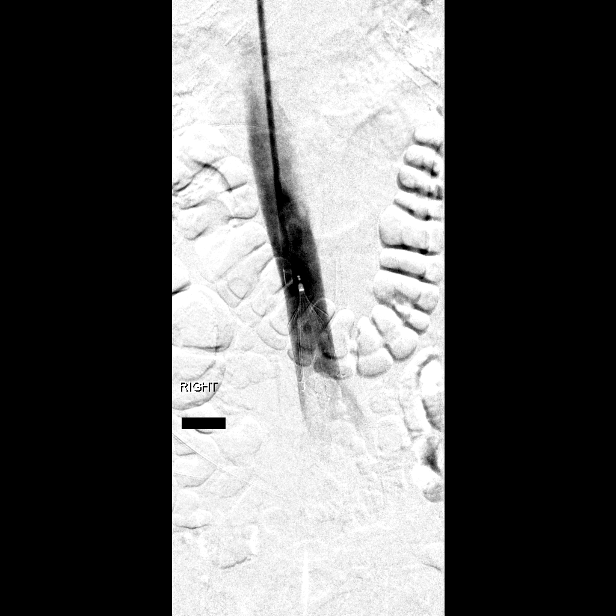
[im 10/18]
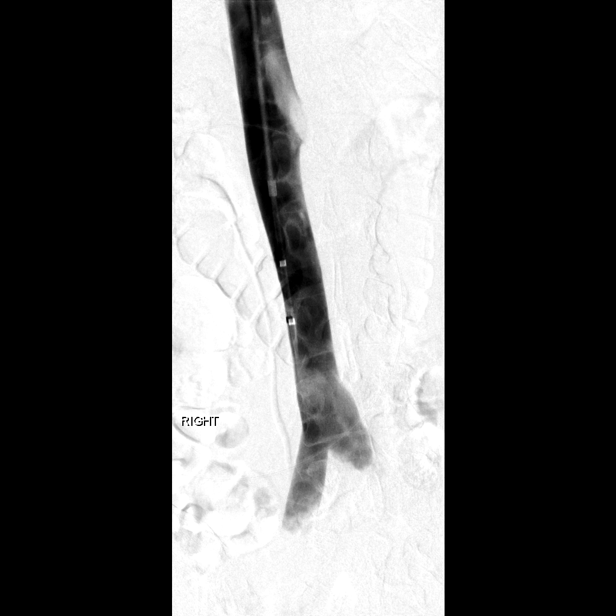
[im 11/18]
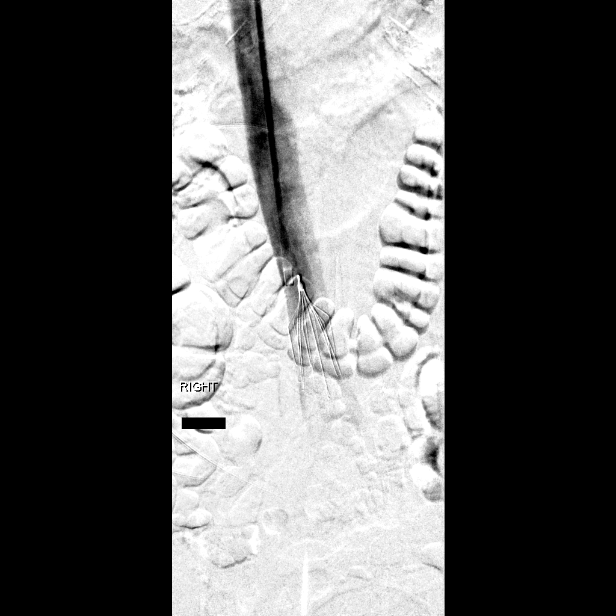
[im 13/18]
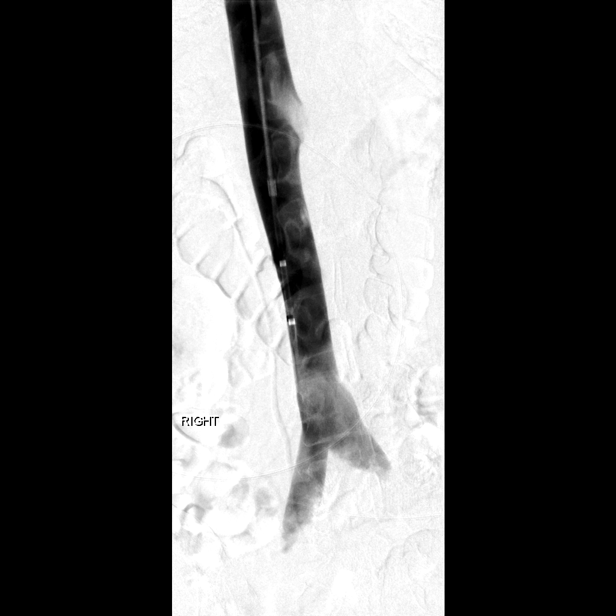
[im 14/18]
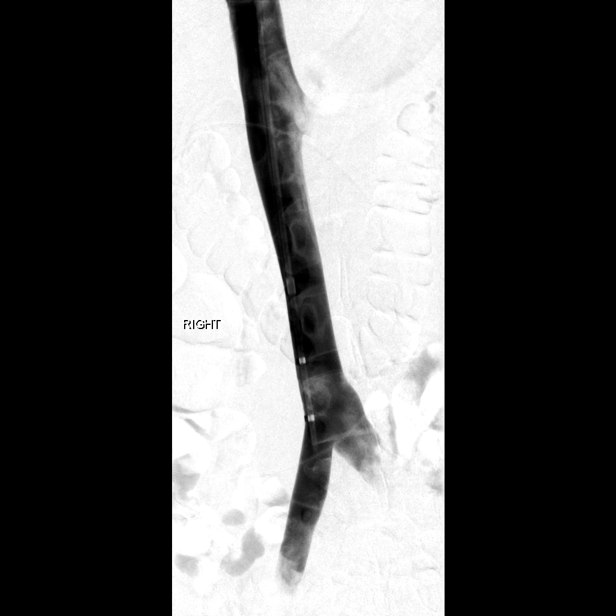
[im 15/18]
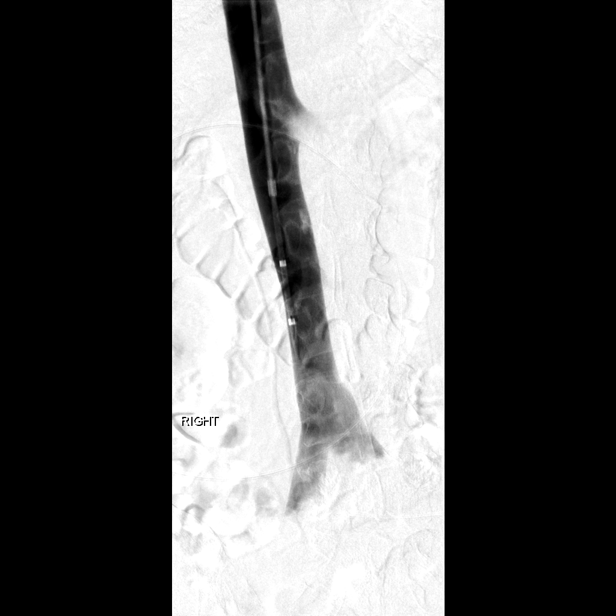
[im 17/18]
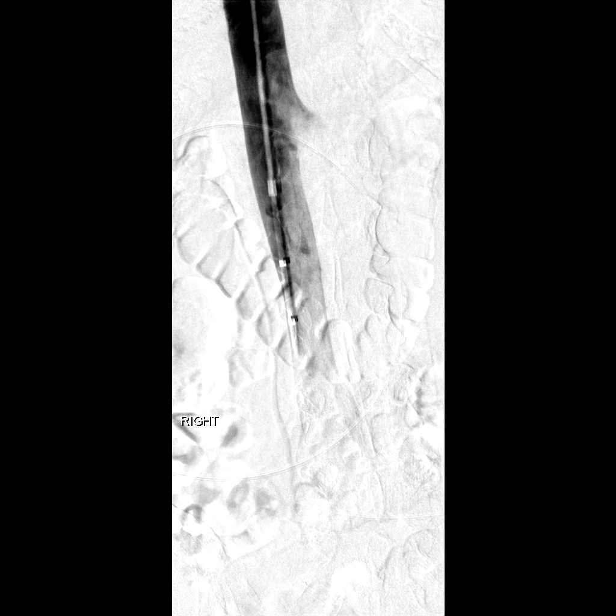
[im 18/18]
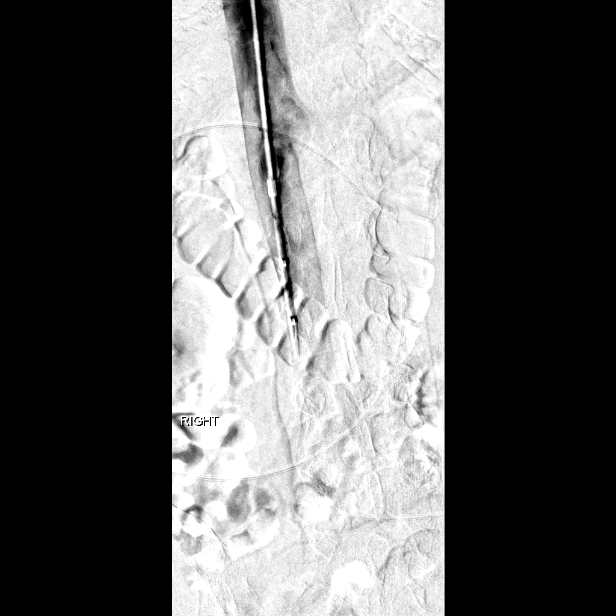

[14 of 18 positions shown; findings below may reference images not displayed]

EXAM:
ULTRASOUND GUIDANCE FOR VASCULARACCESS

IVC CATHETERIZATION AND VENOGRAM

IVC FILTER INSERTION

MEDICATIONS:
None.

ANESTHESIA/SEDATION:
Fentanyl 75 mcg IV; Versed 1.5 mg IV

Moderate Sedation Time:  14 minutes

The patient was continuously monitored during the procedure by the
interventional radiology nurse under my direct supervision.

FLUOROSCOPY TIME:  Fluoroscopy Time: 0 minutes 30 seconds (85 mGy).

COMPLICATIONS:
None immediate.

PROCEDURE:
Informed written consent was obtained from the patient after a
thorough discussion of the procedural risks, benefits and
alternatives. All questions were addressed. Maximal Sterile Barrier
Technique was utilized including caps, mask, sterile gowns, sterile
gloves, sterile drape, hand hygiene and skin antiseptic. A timeout
was performed prior to the initiation of the procedure.

Maximal barrier sterile technique utilized including caps, mask,
sterile gowns, sterile gloves, large sterile drape, hand hygiene,
and Betadine prep.

Under sterile condition and local anesthesia, right internal jugular
venous access was performed with ultrasound. An ultrasound image was
saved and sent to PACS. Over a guidewire, the IVC filter delivery
sheath and inner dilator were advanced into the IVC just above the
IVC bifurcation. Contrast injection was performed for an IVC
venogram.

Through the delivery sheath, a retrievable Denali IVC filter was
deployed below the level of the renal veins and above the IVC
bifurcation. Limited post deployment venacavagram was performed.

The delivery sheath was removed and hemostasis was obtained with
manual compression. A dressing was placed. The patient tolerated the
procedure well without immediate post procedural complication.
FINDINGS: The IVC is patent. No evidence of thrombus, stenosis, or occlusion.
No variant venous anatomy. Successful placement of the IVC filter
below the level of the renal veins.
IMPRESSION: Successful ultrasound and fluoroscopically guided placement of an
infrarenal retrievable IVC filter via right jugular approach.

PLAN:
This IVC filter is potentially retrievable. The patient will be
approximately 8-12 weeks. Further recommendations regarding filter
retrieval, continued surveillance or declaration of device
permanence, will be made at that time.

## 2019-08-06 ENCOUNTER — Ambulatory Visit: Payer: Managed Care, Other (non HMO) | Attending: Internal Medicine

## 2019-08-06 DIAGNOSIS — Z23 Encounter for immunization: Secondary | ICD-10-CM | POA: Insufficient documentation

## 2019-08-06 NOTE — Progress Notes (Signed)
   Covid-19 Vaccination Clinic  Name:  Ruth Charles    MRN: [id number] DOB: December 07, 1970  08/06/2019  Ms. Ruth Charles was observed post Covid-19 immunization for 15 minutes without incident. She was provided with Vaccine Information Sheet and instruction to access the V-Safe system.   Ms. Ruth Charles was instructed to call 911 with any severe reactions post vaccine: [name] Difficulty breathing  . Swelling of face and throat  . A fast heartbeat  . A bad rash all over body  . Dizziness and weakness   Immunizations Administered    Name Date Dose VIS Date Route   Pfizer COVID-19 Vaccine 08/06/2019  1:10 PM 0.3 mL 05/13/2019 Intramuscular   Manufacturer: [employer], [employer]   Lot: [id number]   NDC: [address]-7

## 2019-09-05 ENCOUNTER — Ambulatory Visit: Payer: Managed Care, Other (non HMO)

## 2019-09-06 ENCOUNTER — Ambulatory Visit: Payer: Managed Care, Other (non HMO) | Attending: Internal Medicine

## 2019-09-06 DIAGNOSIS — Z23 Encounter for immunization: Secondary | ICD-10-CM

## 2019-09-06 NOTE — Progress Notes (Signed)
   Covid-19 Vaccination Clinic  Name:  Ruth Charles    MRN: [id number] DOB: [birth year]  09/06/2019  [name] was observed post Covid-19 immunization for 15 minutes without incident. She was provided with Vaccine Information Sheet and instruction to access the V-Safe system.   [name] was instructed to call 911 with any severe reactions post vaccine: [name] Difficulty breathing  . Swelling of face and throat  . A fast heartbeat  . A bad rash all over body  . Dizziness and weakness   Immunizations Administered    Name Date Dose VIS Date Route   Pfizer COVID-19 Vaccine 09/06/2019 10:47 AM 0.3 mL 05/13/2019 Intramuscular   Manufacturer: [employer], [employer]   Lot: [id number]   NDC: [address]-7

## 2019-12-14 IMAGING — US US EXTREM LOW VENOUS BILAT
1 series · 12 of 24 positions shown · non-contrast
Comparison: Prior duplex venous ultrasound of the right lower
extremity 04/13/2017

CLINICAL DATA: 46-year-old female with a history of right lower
extremity DVT in April 2017. Follow-up evaluation. Patient has an
IVC filter in place and is currently on anticoagulation.



[Series 1: us extrem low venous bilat · 0.07mm/px · 12 of 65 slices shown]
[im 3/65]
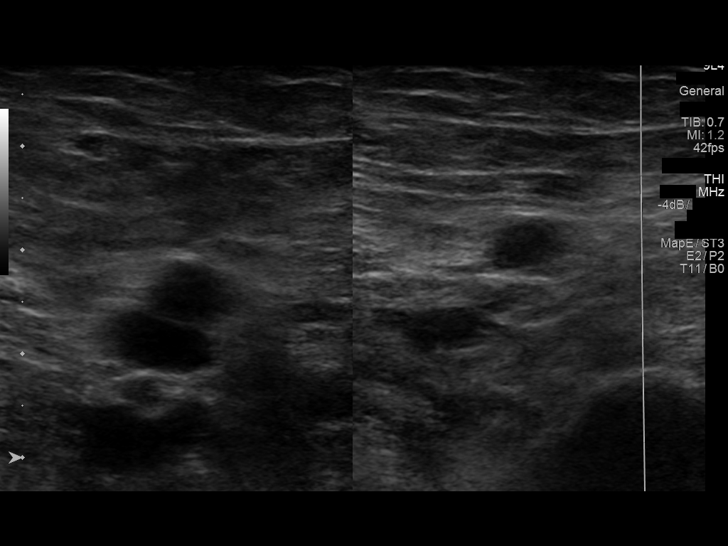
[im 9/65]
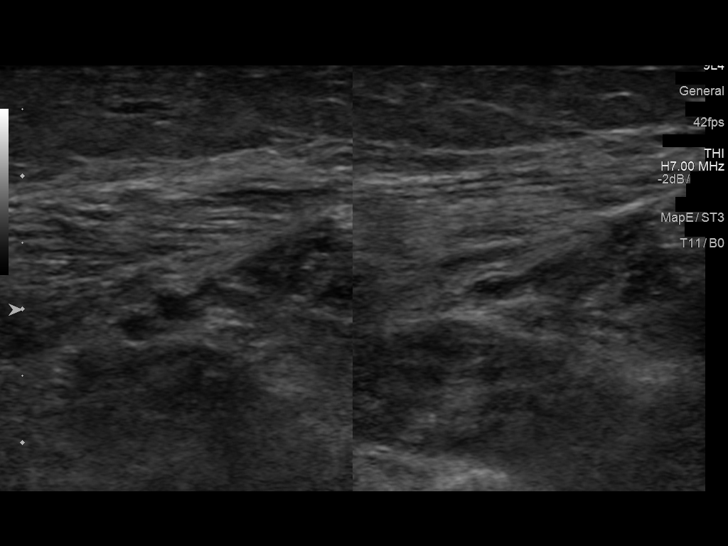
[im 14/65]
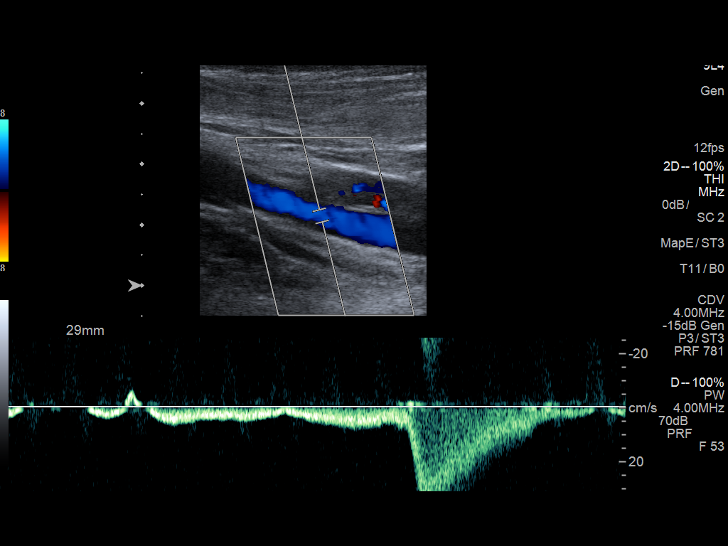
[im 20/65]
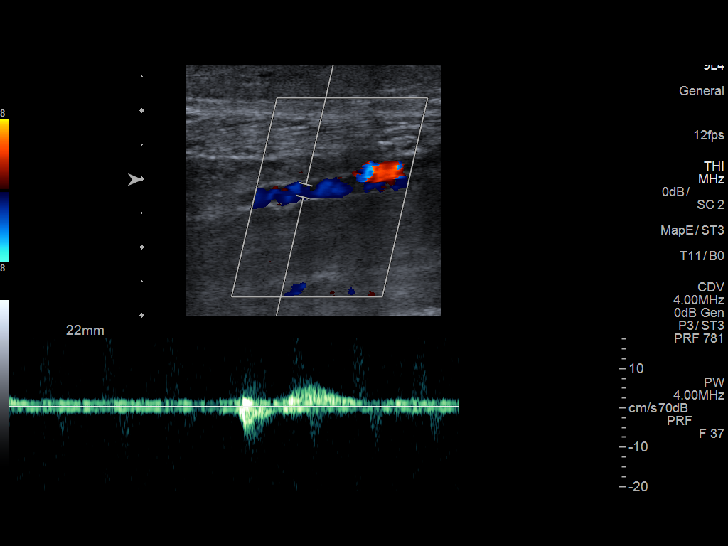
[im 26/65]
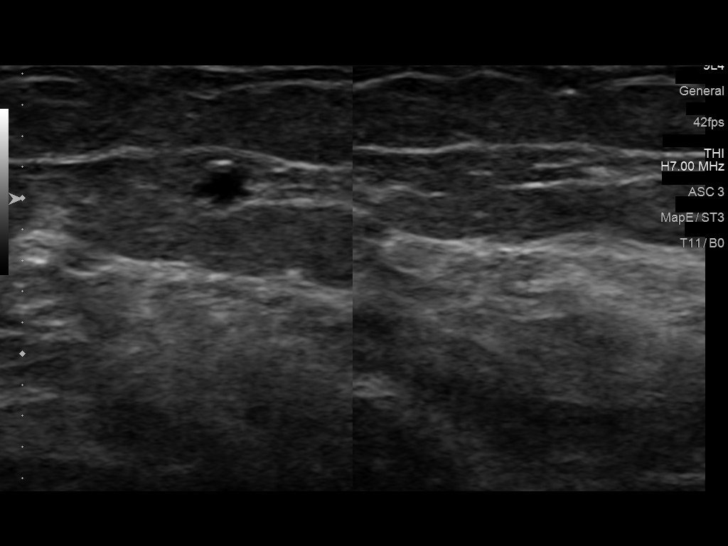
[im 31/65]
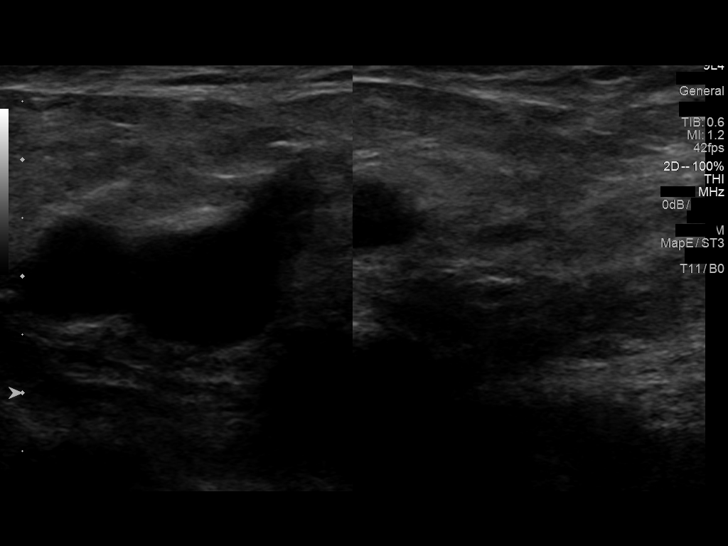
[im 37/65]
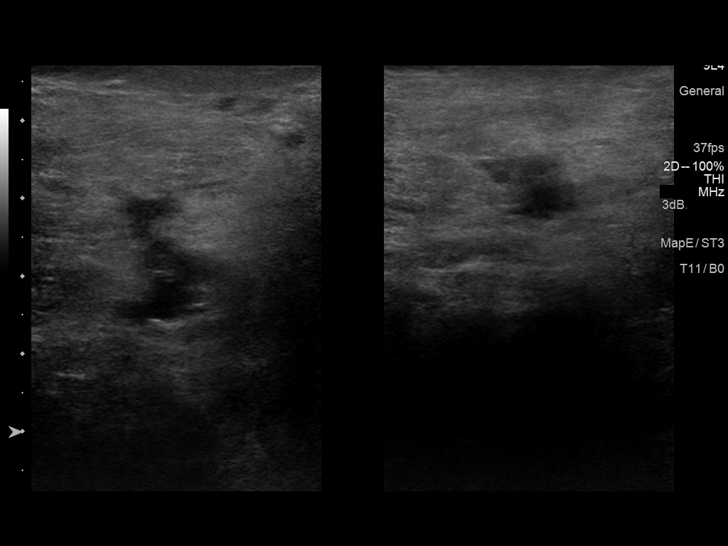
[im 42/65]
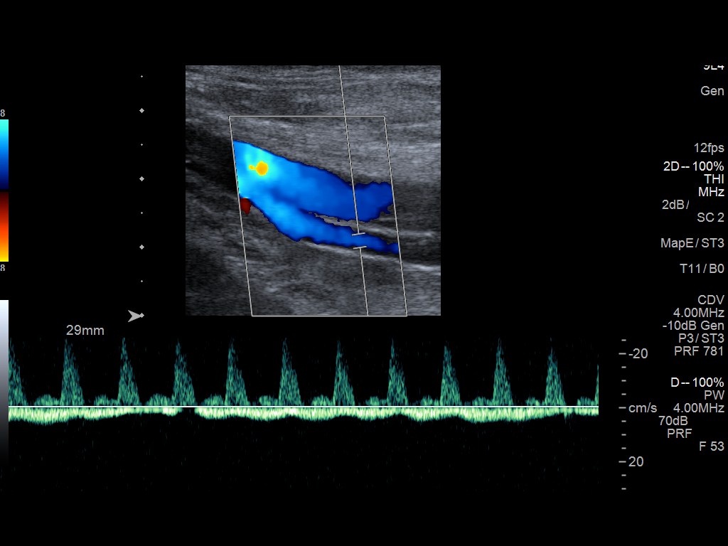
[im 48/65]
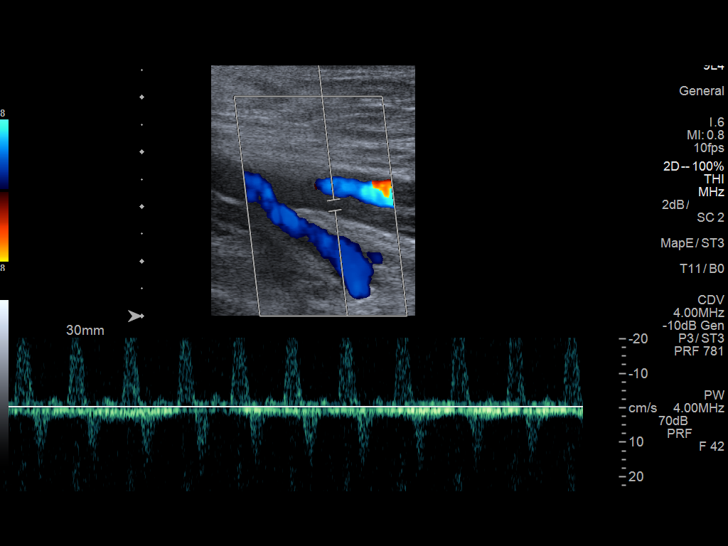
[im 53/65]
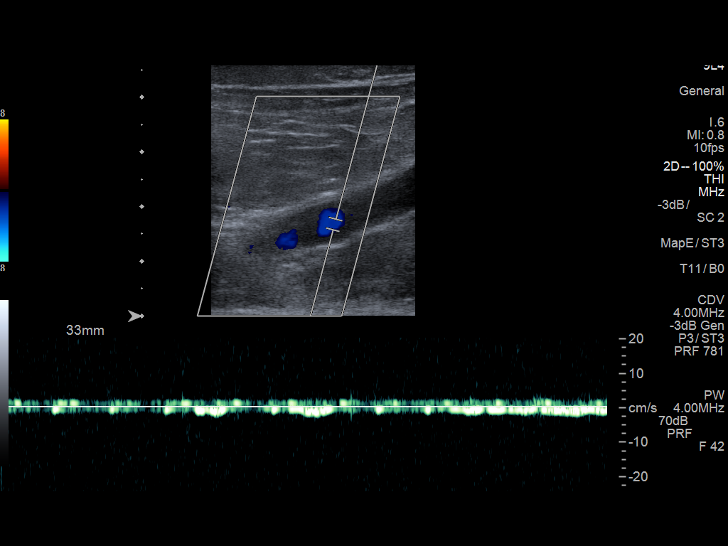
[im 59/65]
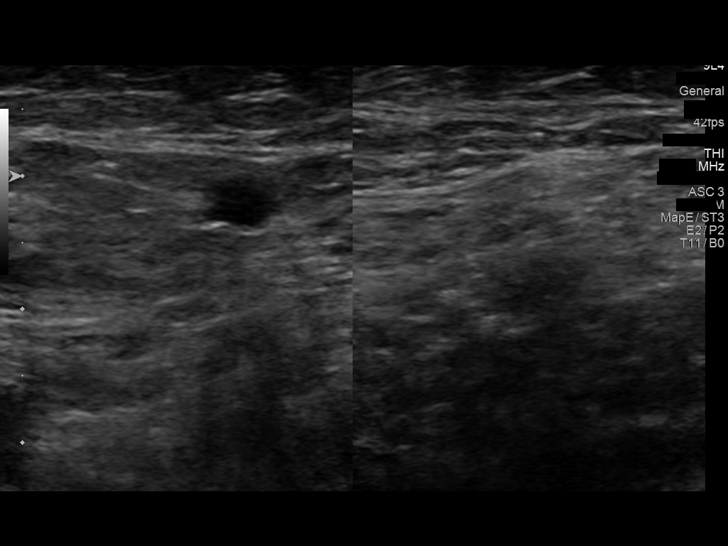
[im 65/65]
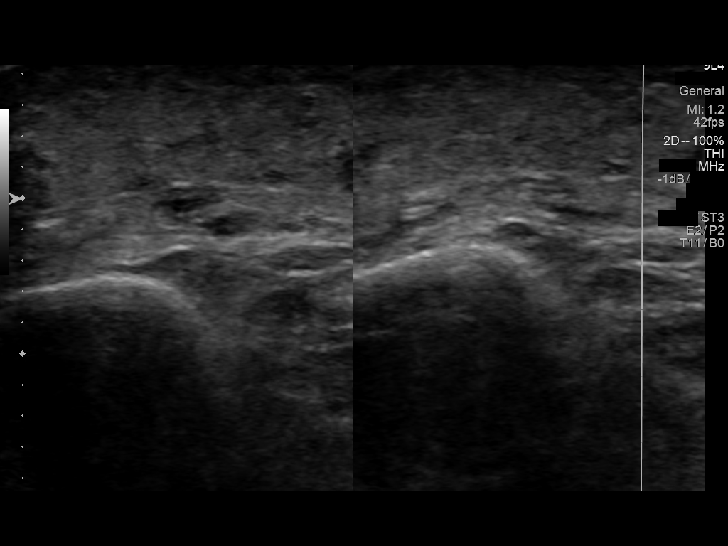

[12 of 24 positions shown; findings below may reference images not displayed]

FINDINGS: RIGHT LOWER EXTREMITY

Common Femoral Vein: No evidence of thrombus. Normal
compressibility, respiratory phasicity and response to augmentation.

Saphenofemoral Junction: No evidence of thrombus. Normal
compressibility and flow on color Doppler imaging.

Profunda Femoral Vein: No evidence of thrombus. Normal
compressibility and flow on color Doppler imaging.

Femoral Vein: The femoral vein is patent. However, beginning in the
and compressible in the proximal thigh proximal to mid thigh, the
vessel becomes un compressible and the lumen is slightly expanded
with internal echogenic material. There is a prominent collateral in
the deep aspect of the thigh.

Popliteal Vein: Chronic thrombus extends through the popliteal vein.
Some developing recanalization with peripheral flow on color Doppler
imaging.

Calf Veins: No evidence of thrombus. Normal compressibility and flow
on color Doppler imaging.

Superficial Great Saphenous Vein: No evidence of thrombus. Normal
compressibility.

Venous Reflux:  None.

Other Findings:  None.

LEFT LOWER EXTREMITY

Common Femoral Vein: No evidence of thrombus. Normal
compressibility, respiratory phasicity and response to augmentation.

Saphenofemoral Junction: No evidence of thrombus. Normal
compressibility and flow on color Doppler imaging.

Profunda Femoral Vein: No evidence of thrombus. Normal
compressibility and flow on color Doppler imaging.

Femoral Vein: No evidence of thrombus. Normal compressibility,
respiratory phasicity and response to augmentation.

Popliteal Vein: No evidence of thrombus. Normal compressibility,
respiratory phasicity and response to augmentation.

Calf Veins: No evidence of thrombus. Normal compressibility and flow
on color Doppler imaging.

Superficial Great Saphenous Vein: No evidence of thrombus. Normal
compressibility.

Venous Reflux:  None.

Other Findings:  None.
IMPRESSION: 1. Significant interval improvement in the volume of DVT in the
right lower extremity compared to 04/13/2017. The right common
femoral vein and the femoral vein in the proximal thigh are now
patent. There is persistent subacute/chronic thrombus throughout the
remainder of the femoral vein in the mid and distal thigh as well as
within the popliteal vein.
2. No evidence of DVT in the left lower extremity.

## 2020-01-24 ENCOUNTER — Ambulatory Visit: Payer: Self-pay | Attending: Internal Medicine

## 2020-01-24 DIAGNOSIS — Z23 Encounter for immunization: Secondary | ICD-10-CM

## 2020-01-24 NOTE — Progress Notes (Signed)
   Covid-19 Vaccination Clinic  Name:  Ruth Charles    MRN: [id number] DOB: 12-20-1970  01/24/2020  Ruth Charles was observed post Covid-19 immunization for 30 minutes based on pre-vaccination screening without incident. She was provided with Vaccine Information Sheet and instruction to access the V-Safe system.   Ruth Charles was instructed to call 911 with any severe reactions post vaccine: [name] Difficulty breathing  . Swelling of face and throat  . A fast heartbeat  . A bad rash all over body  . Dizziness and weakness

## 2020-02-15 ENCOUNTER — Encounter: Payer: Self-pay | Admitting: *Deleted

## 2020-02-16 ENCOUNTER — Ambulatory Visit (INDEPENDENT_AMBULATORY_CARE_PROVIDER_SITE_OTHER): Payer: Managed Care, Other (non HMO) | Admitting: Neurology

## 2020-02-16 ENCOUNTER — Encounter: Payer: Self-pay | Admitting: Neurology

## 2020-02-16 ENCOUNTER — Telehealth: Payer: Self-pay | Admitting: Neurology

## 2020-02-16 ENCOUNTER — Other Ambulatory Visit: Payer: Self-pay

## 2020-02-16 VITALS — BP 136/91 | HR 81 | Ht 63.0 in | Wt 141.5 lb

## 2020-02-16 DIAGNOSIS — R202 Paresthesia of skin: Secondary | ICD-10-CM | POA: Insufficient documentation

## 2020-02-16 MED ORDER — GABAPENTIN 100 MG PO CAPS: 100.0000 mg | ORAL_CAPSULE | Freq: Three times a day (TID) | ORAL | 5 refills | Status: DC

## 2020-02-16 NOTE — Telephone Encounter (Signed)
Medicare/cigna order sent to [facility]. They will obtain the auth for Cigna and reach out to the patient to schedule.  °

## 2020-02-16 NOTE — Progress Notes (Signed)
Chief Complaint  Patient presents with  . Tremor, neuropathy, balance issues    rm 4, New Pt, paper ref    HISTORICAL  Ruth Charles is a 49 year old female, seen in request by her primary care physician Dr. Valentina Lucks, [name], for evaluation of unsteady gait, she is accompanied by her friend at today's visit on February 16, 2020.  I reviewed and summarized the referring note. Past medical history Asthma Anxiety Long history of Psoriatec arthritis DVT, right leg in 2018, was treated with Xarelto in the past, was considered due to birthcontrol side effect, also after prolonged airplane travel. She had a history of IVC filter placed in November 2018 following acute GI bleeding in the setting of anticoagulation, underlying gastritis, improved, later IV filter was taken out  She had long history of psoriasis, frequent skin eruption involving bilateral palms plantar surface.  Over the years, tried different medications, currently taking Stelara since January 2018, still has multiple nonhealing lesions at plantar surface, especially on the right side, palm,  She described burning sensitivity around the skin rash, significant pain, multiple joints pain due to sciatica arthritis, since July 2021, she also noticed intermittent bilateral hand posturing tremor, heightened with her skin lesion and joints pain, the paresthesia mainly centered around the skin rash, limitation of her daily activity due to her paresthesia, joint pain, gait abnormality due to psoriatic rash at bilateral plantar surface  She also has frequent asthma attacks, complains of unpredictable reaction with environmental challenge, often presented with nausea vomiting, diarrhea, difficulty breathing,   REVIEW OF SYSTEMS: Full 14 system review of systems performed and notable only for as above All other review of systems were negative.  ALLERGIES: Allergies  Allergen Reactions  . Corn-Containing Products Other (See Comments)      Chest congestion  . Gluten Meal Other (See Comments)    Nausea and diarrhea  . Lactose Intolerance (Gi) Other (See Comments)    Chest congestion  . Remicade [Infliximab] Shortness Of Breath and Other (See Comments)    "Lightheadedness" also  . Cosentyx [Secukinumab] Other (See Comments)    Worsened symptoms  . Enbrel [Etanercept] Other (See Comments)    Psoriasis worsened  . Humira [Adalimumab] Other (See Comments)    Psoriasis worsened  . Sulfa Antibiotics Hives  . Tape Other (See Comments)    TEARS THE SKIN!! COBAN WRAP IS TOLERATED  . Latex Rash    HOME MEDICATIONS: Current Outpatient Medications  Medication Sig Dispense Refill  . albuterol (PROVENTIL HFA;VENTOLIN HFA) 108 (90 BASE) MCG/ACT inhaler Inhale 2 puffs into the lungs every 6 (six) hours as needed for wheezing or shortness of breath.     . ALPRAZolam (XANAX) 0.25 MG tablet Take 0.25 mg by mouth 3 (three) times daily as needed for anxiety.    . budesonide-formoterol (SYMBICORT) 160-4.5 MCG/ACT inhaler Inhale 2 puffs into the lungs 2 (two) times daily.    . calcium carbonate (TUMS - DOSED IN MG ELEMENTAL CALCIUM) 500 MG chewable tablet Chew 1 tablet by mouth as needed for indigestion or heartburn.    . cetirizine (ZYRTEC) 10 MG tablet Take 10 mg by mouth daily.    . Cholecalciferol (VITAMIN D3) 1.25 MG (50000 UT) TABS Take by mouth every 7 (seven) days.    . diphenhydrAMINE (BENADRYL) 25 MG tablet Take 25 mg every 6 (six) hours as needed by mouth (for "back up" if Zyrtec is not effective).     . fluticasone (FLONASE) 50 MCG/ACT nasal spray Place 1 spray  into both nostrils daily.    . montelukast (SINGULAIR) 10 MG tablet Take 10 mg by mouth at bedtime.    [name] olopatadine (PATANOL) 0.1 % ophthalmic solution 1 drop 2 (two) times daily.    . Olopatadine HCl 0.6 % SOLN Place 2 sprays into the nose daily.    . pantoprazole (PROTONIX) 40 MG tablet Take 1 tablet (40 mg total) by mouth 2 (two) times daily before a meal. 60 tablet  0  . potassium chloride SA (K-DUR,KLOR-CON) 20 MEQ tablet Take 1 tablet (20 mEq total) by mouth daily. (Patient taking differently: Take 20 mEq by mouth daily as needed (with Lasix). ) 7 tablet 0  . pseudoephedrine (SUDAFED) 30 MG tablet Take 30 mg by mouth every 4 (four) hours as needed for congestion.    . sertraline (ZOLOFT) 50 MG tablet Take 50 mg by mouth daily.    . sucralfate (CARAFATE) 1 GM/10ML suspension Take 10 mLs (1 g total) by mouth 2 (two) times daily. 280 mL 0  . tacrolimus (PROTOPIC) 0.1 % ointment Apply topically 2 (two) times daily.    [name] triamcinolone ointment (KENALOG) 0.1 % Apply 1 application topically 2 (two) times daily as needed (for psoriasis).    [name] UNABLE TO FIND Med Name: clobetazol cream    . ustekinumab (STELARA) 90 MG/ML SOSY injection Inject 90 mg into the skin every 3 (three) months.    . desonide (DESOWEN) 0.05 % cream Apply 1 application topically 2 (two) times daily as needed (for psoriasis). (Patient not taking: Reported on 02/16/2020)    . furosemide (LASIX) 40 MG tablet Take 40 mg daily as needed by mouth (for obvious swelling of the ankles). (Patient not taking: Reported on 02/16/2020)    . rivaroxaban (XARELTO) 20 MG TABS tablet Take 20 mg by mouth daily with supper. (Patient not taking: Reported on 02/16/2020)    . traMADol (ULTRAM) 50 MG tablet Take 50 mg by mouth every 8 (eight) hours as needed for moderate pain.  (Patient not taking: Reported on 02/16/2020)     No current facility-administered medications for this visit.    PAST MEDICAL HISTORY: Past Medical History:  Diagnosis Date  . Arthritis   . Asthma   . Bronchitis    hx of   . Chronic deep vein thrombosis (DVT) of popliteal vein of right lower extremity (HCC)   . Complication of anesthesia   . Cough   . Diarrhea   . Fatigue   . Frequent urination at night   . GERD (gastroesophageal reflux disease)   . Gluten intolerance   . Lactose intolerance   . Mumps    hx of   . Night sweats   .  Polyarthralgia   . PONV (postoperative nausea and vomiting)   . Psoriasis   . Psoriatic arthritis (HCC)   . Seasonal allergies   . Shortness of breath dyspnea   . Streptococcal infection    hx of   . Tinnitus   . Tremor   . Wheezing     PAST SURGICAL HISTORY: Past Surgical History:  Procedure Laterality Date  . APPENDECTOMY     2004  . BREAST SURGERY     breast biopsy left   . ESOPHAGOGASTRODUODENOSCOPY N/A 04/22/2017   Procedure: ESOPHAGOGASTRODUODENOSCOPY (EGD);  Surgeon: Kerin Salen, MD;  [facility] ENDOSCOPY;  Service: Gastroenterology;  Laterality: N/A;  . IR IVC FILTER PLMT / S&I /IMG GUID/MOD SED  04/22/2017  . IR IVC FILTER RETRIEVAL / S&I Lenise Arena GUID/MOD SED  07/21/2017  . IR RADIOLOGIST EVAL & MGMT  06/25/2017  . PATELLA-FEMORAL ARTHROPLASTY Left 11/06/2014   Procedure: LEFT KNEE PATELLA-FEMORAL ARTHROPLASTY;  Surgeon: Durene Romans, MD;  Location: [facility] ORS;  Service: Orthopedics;  Laterality: Left;  . removal right ovary with cyst     2004  . right TMJ     2002    FAMILY HISTORY: Family History  Problem Relation Age of Onset  . Fibromyalgia Mother   . Irritable bowel syndrome Mother   . Diabetes Mellitus II Maternal Uncle   . Cancer Maternal Uncle   . Hypertension Maternal Grandmother   . Hypertension Paternal Grandmother   . Hypertension Paternal Grandfather     SOCIAL HISTORY: Social History   Socioeconomic History  . Marital status: Married    Spouse name: Thayer Ohm  . Number of children: 0  . Years of education: Not on file  . Highest education level: Bachelor's degree (e.g., BA, AB, BS)  Occupational History    Comment: teacher retired  Tobacco Use  . Smoking status: Never Smoker  . Smokeless tobacco: Never Used  Vaping Use  . Vaping Use: Never used  Substance and Sexual Activity  . Alcohol use: Yes    Comment: wine and liquor 5-7 glasses per week   . Drug use: Never  . Sexual activity: Not on file  Other Topics Concern  . Not on file  Social  History Narrative   Lives with souse   Caffeine 1-2 daily   Social Determinants of Health   Financial Resource Strain:   . Difficulty of Paying Living Expenses: Not on file  Food Insecurity:   . Worried About Programme researcher, broadcasting/film/video in the Last Year: Not on file  . Ran Out of Food in the Last Year: Not on file  Transportation Needs:   . Lack of Transportation (Medical): Not on file  . Lack of Transportation (Non-Medical): Not on file  Physical Activity:   . Days of Exercise per Week: Not on file  . Minutes of Exercise per Session: Not on file  Stress:   . Feeling of Stress : Not on file  Social Connections:   . Frequency of Communication with Friends and Family: Not on file  . Frequency of Social Gatherings with Friends and Family: Not on file  . Attends Religious Services: Not on file  . Active Member of Clubs or Organizations: Not on file  . Attends Banker Meetings: Not on file  . Marital Status: Not on file  Intimate Partner Violence:   . Fear of Current or Ex-Partner: Not on file  . Emotionally Abused: Not on file  . Physically Abused: Not on file  . Sexually Abused: Not on file     PHYSICAL EXAM   Vitals:   02/16/20 0926  BP: (!) 136/91  Pulse: 81  Weight: 141 lb 8 oz (64.2 kg)  Height: 5\' 3"  (1.6 m)   Not recorded     Body mass index is 25.07 kg/m.  PHYSICAL EXAMNIATION:  Gen: NAD, conversant, well nourised, well groomed                     Cardiovascular: Regular rate rhythm, no peripheral edema, warm, nontender. Eyes: Conjunctivae clear without exudates or hemorrhage Neck: Supple, no carotid bruits. Pulmonary: Clear to auscultation bilaterally   NEUROLOGICAL EXAM:  MENTAL STATUS: Speech:    Speech is normal; fluent and spontaneous with normal comprehension.  Cognition:     Orientation to  time, place and person     Normal recent and remote memory     Normal Attention span and concentration     Normal Language, naming,  repeating,spontaneous speech     Fund of knowledge   CRANIAL NERVES: CN II: Visual fields are full to confrontation. Pupils are round equal and briskly reactive to light. CN III, IV, VI: extraocular movement are normal. No ptosis. CN V: Facial sensation is intact to light touch CN VII: Face is symmetric with normal eye closure  CN VIII: Hearing is normal to causal conversation. CN IX, X: Phonation is normal. CN XI: Head turning and shoulder shrug are intact  MOTOR: There is no pronator drift of out-stretched arms. Muscle bulk and tone are normal. Muscle strength is normal.  REFLEXES: Reflexes are 2+ and symmetric at the biceps, triceps, 3/3 knees, and ankles. Plantar responses are flexor.  SENSORY: Intact to light touch, pinprick and vibratory sensation are intact in fingers and toes.  COORDINATION: There is no trunk or limb dysmetria noted.  GAIT/STANCE: Mildly antalgic due to rash at bilateral plantar surface   DIAGNOSTIC DATA (LABS, IMAGING, TESTING) - I reviewed patient records, labs, notes, testing and imaging myself where available.   ASSESSMENT AND PLAN  Ruth Charles is a 49 y.o. female   Intermittent bilateral hands and feet paresthesia  Hyperreflexia on examinations,  Need to rule out cervical spondylitic myelopathy, MRI of cervical spine  Also need to rule out peripheral neuropathy, EMG nerve conduction study  Symptoms are most likely related to her psoriatic skin rash  Will try low-dose gabapentin 100 mg 3 times daily  Levert Feinstein, M.D. Ph.D.  [facility] [address] [place], [place] [address] Ph: [phone] Fax: [phone]  CC:  Maurice Small, MD [address] [place],  [place] [address]

## 2020-02-17 ENCOUNTER — Encounter: Payer: Self-pay | Admitting: Neurology

## 2020-02-20 NOTE — Telephone Encounter (Signed)
Rutherford Nail: [id number] (exp. 02/17/20 to 05/17/20) patient is scheduled at [facility] for 02/23/20.

## 2020-02-23 ENCOUNTER — Ambulatory Visit
Admission: RE | Admit: 2020-02-23 | Discharge: 2020-02-23 | Disposition: A | Discharge status: Home / Self Care | Payer: Managed Care, Other (non HMO) | Source: Ambulatory Visit | Attending: Neurology | Admitting: Neurology

## 2020-02-23 DIAGNOSIS — R202 Paresthesia of skin: Secondary | ICD-10-CM

## 2020-02-27 ENCOUNTER — Telehealth: Payer: Self-pay | Admitting: Neurology

## 2020-02-27 NOTE — Telephone Encounter (Signed)
I spoke to the patient and provided her with the MRI cervical results below. She will keep her pending appt for NCV/EMG on 03/21/20 for further review with Dr. Terrace Arabia.   She has an appt with her Hazeline Junker at her rheumatology office today and would also like the results faxed to her attention. This was the referring provider. Results faxed and confirmed to [phone].

## 2020-02-27 NOTE — Telephone Encounter (Signed)
   IMPRESSION:  Abnormal MRI Cervical Spine wo contrast showing spondylitic changes throughout most noticeably at C 5-6 and C 6-7 with mild bilateral foraminal stenosis at c 5-6. Illdefined spinal cord patchy hyperintensities from C3- C6 of unclear significance   Please call patient, MRI of the cervical spine showed multilevel degenerative changes, ill-defined spinal cord patchy hyperintensity from C 3 to C6,  We will review MRIs with her at next follow-up visit

## 2020-03-21 ENCOUNTER — Ambulatory Visit (INDEPENDENT_AMBULATORY_CARE_PROVIDER_SITE_OTHER): Payer: Managed Care, Other (non HMO) | Admitting: Neurology

## 2020-03-21 DIAGNOSIS — R202 Paresthesia of skin: Secondary | ICD-10-CM

## 2020-03-21 DIAGNOSIS — R937 Abnormal findings on diagnostic imaging of other parts of musculoskeletal system: Secondary | ICD-10-CM

## 2020-03-21 MED ORDER — GABAPENTIN 100 MG PO CAPS
100.0000 mg | ORAL_CAPSULE | Freq: Three times a day (TID) | ORAL | 4 refills | Status: DC
Start: 2020-03-21 — End: 2020-09-19

## 2020-03-21 NOTE — Progress Notes (Signed)
No chief complaint on file.   HISTORICAL  Ruth Charles is a 49 year old female, seen in request by her primary care physician Dr. Valentina Lucks, [name], for evaluation of unsteady gait, she is accompanied by her friend at today's visit on February 16, 2020.  I reviewed and summarized the referring note. Past medical history Asthma Anxiety Long history of Psoriatec arthritis DVT, right leg in 2018, was treated with Xarelto in the past, was considered due to birthcontrol side effect, also after prolonged airplane travel. She had a history of IVC filter placed in November 2018 following acute GI bleeding in the setting of anticoagulation, underlying gastritis, improved, later IV filter was taken out  She had long history of psoriasis, frequent skin eruption involving bilateral palms plantar surface.  Over the years, tried different medications, currently taking Stelara since January 2018, still has multiple nonhealing lesions at plantar surface, especially on the right side, palm,  She described burning sensitivity around the skin rash, significant pain, multiple joints pain due to sciatica arthritis, since July 2021, she also noticed intermittent bilateral hand posturing tremor, heightened with her skin lesion and joints pain, the paresthesia mainly centered around the skin rash, limitation of her daily activity due to her paresthesia, joint pain, gait abnormality due to psoriatic rash at bilateral plantar surface  She also has frequent asthma attacks, complains of unpredictable reaction with environmental challenge, often presented with nausea vomiting, diarrhea, difficulty breathing,  UPDATE Mar 21 2020: EMG/NCS showed no peripheral neuropathy, mild right carpal tunnel syndrome  She has tried gabapentin only 100 mg every night, does help her symptoms, also help her sleep better  Personally reviewed MRI of cervical spine in September 2021: Multilevel degenerative changes, ill-defined  spinal cord patchy hyperintensity from C 3-6 of unclear significance  She has mild gait abnormality, mainly attributed to her sole discomfort, pain, denies bowel and bladder incontinence, denies radiating pain to bilateral upper extremity  REVIEW OF SYSTEMS: Full 14 system review of systems performed and notable only for as above All other review of systems were negative.  ALLERGIES: Allergies  Allergen Reactions  . Corn-Containing Products Other (See Comments)    Chest congestion  . Gluten Meal Other (See Comments)    Nausea and diarrhea  . Lactose Intolerance (Gi) Other (See Comments)    Chest congestion  . Remicade [Infliximab] Shortness Of Breath and Other (See Comments)    "Lightheadedness" also  . Cosentyx [Secukinumab] Other (See Comments)    Worsened symptoms  . Enbrel [Etanercept] Other (See Comments)    Psoriasis worsened  . Humira [Adalimumab] Other (See Comments)    Psoriasis worsened  . Sulfa Antibiotics Hives  . Tape Other (See Comments)    TEARS THE SKIN!! COBAN WRAP IS TOLERATED  . Latex Rash    HOME MEDICATIONS: Current Outpatient Medications  Medication Sig Dispense Refill  . albuterol (PROVENTIL HFA;VENTOLIN HFA) 108 (90 BASE) MCG/ACT inhaler Inhale 2 puffs into the lungs every 6 (six) hours as needed for wheezing or shortness of breath.     . ALPRAZolam (XANAX) 0.25 MG tablet Take 0.25 mg by mouth 3 (three) times daily as needed for anxiety.    . budesonide-formoterol (SYMBICORT) 160-4.5 MCG/ACT inhaler Inhale 2 puffs into the lungs 2 (two) times daily.    . calcium carbonate (TUMS - DOSED IN MG ELEMENTAL CALCIUM) 500 MG chewable tablet Chew 1 tablet by mouth as needed for indigestion or heartburn.    . cetirizine (ZYRTEC) 10 MG tablet Take 10 mg  by mouth daily.    . Cholecalciferol (VITAMIN D3) 1.25 MG (50000 UT) TABS Take by mouth every 7 (seven) days.    [name] desonide (DESOWEN) 0.05 % cream Apply 1 application topically 2 (two) times daily as needed (for  psoriasis). (Patient not taking: Reported on 02/16/2020)    . diphenhydrAMINE (BENADRYL) 25 MG tablet Take 25 mg every 6 (six) hours as needed by mouth (for "back up" if Zyrtec is not effective).     . fluticasone (FLONASE) 50 MCG/ACT nasal spray Place 1 spray into both nostrils daily.    [name] gabapentin (NEURONTIN) 100 MG capsule Take 1 capsule (100 mg total) by mouth 3 (three) times daily. 90 capsule 5  . montelukast (SINGULAIR) 10 MG tablet Take 10 mg by mouth at bedtime.    [name] olopatadine (PATANOL) 0.1 % ophthalmic solution 1 drop 2 (two) times daily.    . Olopatadine HCl 0.6 % SOLN Place 2 sprays into the nose daily.    . pantoprazole (PROTONIX) 40 MG tablet Take 1 tablet (40 mg total) by mouth 2 (two) times daily before a meal. 60 tablet 0  . potassium chloride SA (K-DUR,KLOR-CON) 20 MEQ tablet Take 1 tablet (20 mEq total) by mouth daily. (Patient taking differently: Take 20 mEq by mouth daily as needed (with Lasix). ) 7 tablet 0  . pseudoephedrine (SUDAFED) 30 MG tablet Take 30 mg by mouth every 4 (four) hours as needed for congestion.    . rivaroxaban (XARELTO) 20 MG TABS tablet Take 20 mg by mouth daily with supper. (Patient not taking: Reported on 02/16/2020)    . sucralfate (CARAFATE) 1 GM/10ML suspension Take 10 mLs (1 g total) by mouth 2 (two) times daily. 280 mL 0  . tacrolimus (PROTOPIC) 0.1 % ointment Apply topically 2 (two) times daily.    [name] triamcinolone ointment (KENALOG) 0.1 % Apply 1 application topically 2 (two) times daily as needed (for psoriasis).    [name] UNABLE TO FIND Med Name: clobetazol cream    . ustekinumab (STELARA) 90 MG/ML SOSY injection Inject 90 mg into the skin every 3 (three) months.     No current facility-administered medications for this visit.    PAST MEDICAL HISTORY: Past Medical History:  Diagnosis Date  . Arthritis   . Asthma   . Bronchitis    hx of   . Chronic deep vein thrombosis (DVT) of popliteal vein of right lower extremity (HCC)   . Complication  of anesthesia   . Cough   . Diarrhea   . Fatigue   . Frequent urination at night   . GERD (gastroesophageal reflux disease)   . Gluten intolerance   . Lactose intolerance   . Mumps    hx of   . Night sweats   . Polyarthralgia   . PONV (postoperative nausea and vomiting)   . Psoriasis   . Psoriatic arthritis (HCC)   . Seasonal allergies   . Shortness of breath dyspnea   . Streptococcal infection    hx of   . Tinnitus   . Tremor   . Wheezing     PAST SURGICAL HISTORY: Past Surgical History:  Procedure Laterality Date  . APPENDECTOMY     2004  . BREAST SURGERY     breast biopsy left   . ESOPHAGOGASTRODUODENOSCOPY N/A 04/22/2017   Procedure: ESOPHAGOGASTRODUODENOSCOPY (EGD);  Surgeon: Kerin Salen, MD;  Location: [facility] ENDOSCOPY;  Service: Gastroenterology;  Laterality: N/A;  . IR IVC FILTER PLMT / S&I Lenise Arena GUID/MOD SED  04/22/2017  . IR IVC FILTER RETRIEVAL / S&I /IMG GUID/MOD SED  07/21/2017  . IR RADIOLOGIST EVAL & MGMT  06/25/2017  . PATELLA-FEMORAL ARTHROPLASTY Left 11/06/2014   Procedure: LEFT KNEE PATELLA-FEMORAL ARTHROPLASTY;  Surgeon: Durene Romans, MD;  Location: [facility] ORS;  Service: Orthopedics;  Laterality: Left;  . removal right ovary with cyst     2004  . right TMJ     2002    FAMILY HISTORY: Family History  Problem Relation Age of Onset  . Fibromyalgia Mother   . Irritable bowel syndrome Mother   . Diabetes Mellitus II Maternal Uncle   . Cancer Maternal Uncle   . Hypertension Maternal Grandmother   . Hypertension Paternal Grandmother   . Hypertension Paternal Grandfather     SOCIAL HISTORY: Social History   Socioeconomic History  . Marital status: Married    Spouse name: Thayer Ohm  . Number of children: 0  . Years of education: Not on file  . Highest education level: Bachelor's degree (e.g., BA, AB, BS)  Occupational History    Comment: teacher retired  Tobacco Use  . Smoking status: Never Smoker  . Smokeless tobacco: Never Used  Vaping Use  . Vaping  Use: Never used  Substance and Sexual Activity  . Alcohol use: Yes    Comment: wine and liquor 5-7 glasses per week   . Drug use: Never  . Sexual activity: Not on file  Other Topics Concern  . Not on file  Social History Narrative   Lives with souse   Caffeine 1-2 daily   Social Determinants of Health   Financial Resource Strain:   . Difficulty of Paying Living Expenses: Not on file  Food Insecurity:   . Worried About Programme researcher, broadcasting/film/video in the Last Year: Not on file  . Ran Out of Food in the Last Year: Not on file  Transportation Needs:   . Lack of Transportation (Medical): Not on file  . Lack of Transportation (Non-Medical): Not on file  Physical Activity:   . Days of Exercise per Week: Not on file  . Minutes of Exercise per Session: Not on file  Stress:   . Feeling of Stress : Not on file  Social Connections:   . Frequency of Communication with Friends and Family: Not on file  . Frequency of Social Gatherings with Friends and Family: Not on file  . Attends Religious Services: Not on file  . Active Member of Clubs or Organizations: Not on file  . Attends Banker Meetings: Not on file  . Marital Status: Not on file  Intimate Partner Violence:   . Fear of Current or Ex-Partner: Not on file  . Emotionally Abused: Not on file  . Physically Abused: Not on file  . Sexually Abused: Not on file     PHYSICAL EXAM   There were no vitals filed for this visit. Not recorded     There is no height or weight on file to calculate BMI.  PHYSICAL EXAMNIATION:  Gen: NAD, conversant, well nourised, well groomed  NEUROLOGICAL EXAM:  MENTAL STATUS: Speech/cognition: Awake, alert, oriented to history taking and casual conversation   CRANIAL NERVES: CN II: Visual fields are full to confrontation. Pupils are round equal and briskly reactive to light. CN III, IV, VI: extraocular movement are normal. No ptosis. CN V: Facial sensation is intact to light touch CN  VII: Face is symmetric with normal eye closure  CN VIII: Hearing is normal to causal conversation. CN  IX, X: Phonation is normal. CN XI: Head turning and shoulder shrug are intact  MOTOR: There is no pronator drift of out-stretched arms. Muscle bulk and tone are normal. Muscle strength is normal.  REFLEXES: Reflexes are 2+ and symmetric at the biceps, triceps, 3/3 knees, and ankles. Plantar responses are flexor.  SENSORY: Intact to light touch, pinprick and vibratory sensation are intact in fingers and toes.  COORDINATION: There is no trunk or limb dysmetria noted.  GAIT/STANCE: Mildly antalgic due to rash at bilateral plantar surface   DIAGNOSTIC DATA (LABS, IMAGING, TESTING) - I reviewed patient records, labs, notes, testing and imaging myself where available.   ASSESSMENT AND PLAN  SHARANDA STUEBE is a 49 y.o. female   Intermittent bilateral hands and feet paresthesia  Hyperreflexia on examinations,  MRI of the cervical showed degenerative changes, ill-defined patchy area of signal abnormality from C3-C6,  EMG nerve conduction study in October 2021 showed no evidence of large fiber peripheral neuropathy, mild right carpal tunnel syndromes,  Her complaints of bilateral hands and feet paresthesia most likely due to her psoriatic skin rash, and not sure the described abnormal findings on the MRI cervical spine represent true pathology, versus artifact,  Laboratory evaluation to rule out inflammatory, nutritional deficiency, infectious etiology for her cervical cord signal abnormality  Will consider repeat MRI of cervical spine with without contrast in September 2022, a year from the initial scan, to see if there are any changes,  Continue gabapentin 100 mg 3 times a day,  Levert Feinstein, M.D. Ph.D.  [facility] [address] [place], [place] [address] Ph: [phone] Fax: [phone]  CC:  Maurice Small, MD [address] [place],  [place] [address]

## 2020-03-27 LAB — MULTIPLE MYELOMA PANEL, SERUM
Albumin SerPl Elph-Mcnc: 4.1 g/dL (ref 2.9–4.4)
Albumin/Glob SerPl: 1.6 (ref 0.7–1.7)
Alpha 1: 0.4 g/dL (ref 0.0–0.4)
Alpha2 Glob SerPl Elph-Mcnc: 0.6 g/dL (ref 0.4–1.0)
B-Globulin SerPl Elph-Mcnc: 0.9 g/dL (ref 0.7–1.3)
Gamma Glob SerPl Elph-Mcnc: 0.9 g/dL (ref 0.4–1.8)
Globulin, Total: 2.7 g/dL (ref 2.2–3.9)
IgA/Immunoglobulin A, Serum: 274 mg/dL (ref 87–352)
IgG (Immunoglobin G), Serum: 745 mg/dL (ref 586–1602)
IgM (Immunoglobulin M), Srm: 174 mg/dL (ref 26–217)

## 2020-03-27 LAB — CBC WITH DIFFERENTIAL/PLATELET
Basophils Absolute: 0 10*3/uL (ref 0.0–0.2)
Basos: 1 %
EOS (ABSOLUTE): 0 10*3/uL (ref 0.0–0.4)
Eos: 1 %
Hematocrit: 39.2 % (ref 34.0–46.6)
Hemoglobin: 14.2 g/dL (ref 11.1–15.9)
Immature Grans (Abs): 0.1 10*3/uL (ref 0.0–0.1)
Immature Granulocytes: 1 %
Lymphocytes Absolute: 0.7 10*3/uL (ref 0.7–3.1)
Lymphs: 11 %
MCH: 39.3 pg — ABNORMAL HIGH (ref 26.6–33.0)
MCHC: 36.2 g/dL — ABNORMAL HIGH (ref 31.5–35.7)
MCV: 109 fL — ABNORMAL HIGH (ref 79–97)
Monocytes Absolute: 0.6 10*3/uL (ref 0.1–0.9)
Monocytes: 9 %
Neutrophils Absolute: 5.2 10*3/uL (ref 1.4–7.0)
Neutrophils: 77 %
Platelets: 147 10*3/uL — ABNORMAL LOW (ref 150–450)
RBC: 3.61 x10E6/uL — ABNORMAL LOW (ref 3.77–5.28)
RDW: 15.7 % — ABNORMAL HIGH (ref 11.7–15.4)
WBC: 6.5 10*3/uL (ref 3.4–10.8)

## 2020-03-27 LAB — COMPREHENSIVE METABOLIC PANEL
ALT: 94 IU/L — ABNORMAL HIGH (ref 0–32)
AST: 158 IU/L — ABNORMAL HIGH (ref 0–40)
Albumin/Globulin Ratio: 2 (ref 1.2–2.2)
Albumin: 4.5 g/dL (ref 3.8–4.9)
Alkaline Phosphatase: 112 IU/L (ref 44–121)
BUN/Creatinine Ratio: 14 (ref 9–23)
BUN: 13 mg/dL (ref 6–24)
Bilirubin Total: 2.3 mg/dL — ABNORMAL HIGH (ref 0.0–1.2)
CO2: 24 mmol/L (ref 20–29)
Calcium: 10.8 mg/dL — ABNORMAL HIGH (ref 8.7–10.2)
Chloride: 96 mmol/L (ref 96–106)
Creatinine, Ser: 0.93 mg/dL (ref 0.57–1.00)
GFR calc Af Amer: 82 mL/min/{1.73_m2} (ref >59)
GFR calc non Af Amer: 71 mL/min/{1.73_m2} (ref >59)
Globulin, Total: 2.3 g/dL (ref 1.5–4.5)
Glucose: 95 mg/dL (ref 65–99)
Potassium: 3.8 mmol/L (ref 3.5–5.2)
Sodium: 135 mmol/L (ref 134–144)
Total Protein: 6.8 g/dL (ref 6.0–8.5)

## 2020-03-27 LAB — RPR: RPR Ser Ql: NONREACTIVE

## 2020-03-27 LAB — HEPATITIS PANEL, ACUTE
Hep A IgM: NEGATIVE
Hep B C IgM: NEGATIVE
Hep C Virus Ab: <0.1 s/co ratio (ref 0.0–0.9)
Hepatitis B Surface Ag: NEGATIVE

## 2020-03-27 LAB — VITAMIN D 25 HYDROXY (VIT D DEFICIENCY, FRACTURES): Vit D, 25-Hydroxy: 30.4 ng/mL (ref 30.0–100.0)

## 2020-03-27 LAB — ANA W/REFLEX IF POSITIVE: Anti Nuclear Antibody (ANA): NEGATIVE

## 2020-03-27 LAB — C-REACTIVE PROTEIN: CRP: 8 mg/L (ref 0–10)

## 2020-03-27 LAB — SEDIMENTATION RATE: Sed Rate: 4 mm/hr (ref 0–40)

## 2020-03-27 LAB — VITAMIN B12: Vitamin B-12: 447 pg/mL (ref 232–1245)

## 2020-03-27 LAB — HIV ANTIBODY (ROUTINE TESTING W REFLEX): HIV Screen 4th Generation wRfx: NONREACTIVE

## 2020-03-27 LAB — HGB A1C W/O EAG: Hgb A1c MFr Bld: 4.3 % — ABNORMAL LOW (ref 4.8–5.6)

## 2020-03-27 LAB — COPPER, SERUM: Copper: 64 ug/dL — ABNORMAL LOW (ref 80–158)

## 2020-03-28 ENCOUNTER — Telehealth: Payer: Self-pay | Admitting: Neurology

## 2020-03-28 NOTE — Telephone Encounter (Signed)
-----   Message from Lilla Shook, RN sent at 03/28/2020  9:06 AM EDT ----- Dr. Terrace Arabia is out this week. Thank you. ----- Message ----- From: Nell Range Lab Results In Sent: 03/22/2020   7:38 AM EDT To: Levert Feinstein, MD

## 2020-03-28 NOTE — Telephone Encounter (Signed)
I called and talk with the patient.  The blood work shows elevation in liver enzymes, more so than in the past, the copper level is low and may be a manifestation of the abnormal liver function.  The MCV is elevated.  The patient had a hepatitis panel that was negative.  The patient does drink alcohol, she indicates that she probably drinks too much, I have asked her to try to stop drinking alcohol, the liver will need to be followed.  I will send the reports of the blood work to her primary doctor and to her rheumatology doctor.

## 2020-04-11 NOTE — Procedures (Signed)
Full Name: Shakesha Soltau Gender: Female MRN #: [id number] Date of Birth: 05-Jan-1971    Visit Date: 03/21/2020 09:24 Age: 49 Years Examining Physician: Levert Feinstein, MD  Referring Physician: Levert Feinstein, MD Height: 5 feet 3 inch Patient History: 34.8 C -hand temp, warmed to 36.8 C History: 49 year old female, complains of paresthesia around her skin rash  Summary of the test: Nerve conduction study: Right sural, superficial peroneal, ulnar, median, radial sensory responses were normal. Right peroneal to EDB, tibial, median and ulnar motor responses were normal.  Electromyography: Selected needle examination of right upper, lower extremity muscles, cervical and lumbar paraspinal muscles were normal.   Conclusion: This is a normal study.  There is no electrodiagnostic evidence of large fiber peripheral neuropathy, right cervical or lumbosacral radiculopathy.    ------------------------------- Levert Feinstein, M.D. PhD  [facility] [address] [place], [place] [address] Tel: [phone] Fax: [phone]  Verbal informed consent was obtained from the patient, patient was informed of potential risk of procedure, including bruising, bleeding, hematoma formation, infection, muscle weakness, muscle pain, numbness, among others.         MNC    Nerve / Sites Muscle Latency Ref. Amplitude Ref. Rel Amp Segments Distance Velocity Ref. Area    ms ms mV mV %  cm m/s m/s mVms  R Median - APB     Wrist APB 4.4 ?4.4 4.8 ?4.0 100 Wrist - APB 7   23.7     Upper arm APB 9.0  4.6  96.5 Upper arm - Wrist 23 50 ?49 22.5  R Ulnar - ADM     Wrist ADM 3.2 ?3.3 8.7 ?6.0 100 Wrist - ADM 7   43.4     B.Elbow ADM 7.1  8.2  95.1 B.Elbow - Wrist 20 52 ?49 42.2     A.Elbow ADM 9.0  7.9  96.2 A.Elbow - B.Elbow 10 52 ?49 41.4         A.Elbow - Wrist      R Peroneal - EDB     Ankle EDB 4.8 ?6.5 3.7 ?2.0 100 Ankle - EDB 9   14.1     Fib head EDB 11.0  3.5  94.8 Fib head - Ankle 27 44  ?44 13.2     Pop fossa EDB 13.3  3.4  96.4 Pop fossa - Fib head 10 44 ?44 12.8         Pop fossa - Ankle      R Tibial - AH     Ankle AH 4.3 ?5.8 7.8 ?4.0 100 Ankle - AH 9   30.7     Pop fossa AH 13.1  6.9  88.6 Pop fossa - Ankle 36 41 ?41 33.5             SNC    Nerve / Sites Rec. Site Peak Lat Ref.  Amp Ref. Segments Distance Peak Diff Ref.    ms ms V V  cm ms ms  R Radial - Anatomical snuff box (Forearm)     Forearm Wrist 2.7 ?2.9 32 ?15 Forearm - Wrist 10    R Sural - Ankle (Calf)     Calf Ankle 4.1 ?4.4 9 ?6 Calf - Ankle 14    R Superficial peroneal - Ankle     Lat leg Ankle 4.4 ?4.4 6 ?6 Lat leg - Ankle 14    R Median, Ulnar - Transcarpal comparison     Median Palm Wrist 2.6 ?2.2  136 ?35 Median Palm - Wrist 8       Ulnar Palm Wrist 2.4 ?2.2 42 ?12 Ulnar Palm - Wrist 8          Median Palm - Ulnar Palm  0.3 ?0.4  R Median - Orthodromic (Dig II, Mid palm)     Dig II Wrist 3.8 ?3.4 18 ?10 Dig II - Wrist 13    R Ulnar - Orthodromic, (Dig V, Mid palm)     Dig V Wrist 3.1 ?3.1 15 ?5 Dig V - Wrist 75                   F  Wave    Nerve F Lat Ref.   ms ms  R Tibial - AH 48.1 ?56.0  R Ulnar - ADM 30.1 ?32.0         EMG Summary Table    Spontaneous MUAP Recruitment  Muscle IA Fib PSW Fasc Other Amp Dur. Poly Pattern  R. Tibialis anterior Normal None None None _______ Normal Normal Normal Normal  R. Tibialis posterior Normal None None None _______ Normal Normal Normal Normal  R. Gastrocnemius (Medial head) Normal None None None _______ Normal Normal Normal Normal  R. Peroneus longus Normal None None None _______ Normal Normal Normal Normal  R. Vastus lateralis Normal None None None _______ Normal Normal Normal Normal  R. Lumbar paraspinals (low) Normal None None None _______ Normal Normal Normal Normal  R. Lumbar paraspinals (mid) Normal None None None _______ Normal Normal Normal Normal  R. First dorsal interosseous Normal None None None _______ Normal Normal Normal Normal   R. Pronator teres Normal None None None _______ Normal Normal Normal Normal  R. Biceps brachii Normal None None None _______ Normal Normal Normal Normal  R. Deltoid Normal None None None _______ Normal Normal Normal Normal  R. Triceps brachii Normal None None None _______ Normal Normal Normal Normal  R. Cervical paraspinals Normal None None None _______ Normal Normal Normal Normal

## 2020-05-14 ENCOUNTER — Other Ambulatory Visit: Payer: Self-pay | Admitting: Neurology

## 2020-05-14 MED ORDER — GABAPENTIN 300 MG PO CAPS: 300.0000 mg | ORAL_CAPSULE | Freq: Three times a day (TID) | ORAL | 11 refills | Status: DC

## 2020-05-14 NOTE — Progress Notes (Signed)
Patient request high dose of gabapentin, was on 100mg  tid, increase to 300mg  tid.  New Rx was E-Rxed  Meds ordered this encounter  Medications  . gabapentin (NEURONTIN) 300 MG capsule    Sig: Take 1 capsule (300 mg total) by mouth 3 (three) times daily.    Dispense:  90 capsule    Refill:  11   .

## 2020-09-03 ENCOUNTER — Ambulatory Visit (HOSPITAL_COMMUNITY)
Admission: RE | Admit: 2020-09-03 | Discharge: 2020-09-03 | Disposition: A | Discharge status: Home / Self Care | Payer: Managed Care, Other (non HMO) | Source: Ambulatory Visit | Attending: Physician Assistant | Admitting: Physician Assistant

## 2020-09-03 ENCOUNTER — Other Ambulatory Visit: Payer: Self-pay

## 2020-09-03 ENCOUNTER — Other Ambulatory Visit (HOSPITAL_COMMUNITY): Payer: Self-pay | Admitting: Physician Assistant

## 2020-09-03 DIAGNOSIS — M7989 Other specified soft tissue disorders: Secondary | ICD-10-CM

## 2020-09-03 DIAGNOSIS — M79602 Pain in left arm: Secondary | ICD-10-CM | POA: Diagnosis present

## 2020-09-18 NOTE — Progress Notes (Addendum)
Chief Complaint  Patient presents with  . Follow-up    [name] , with Ruth Charles, states gabapentin is working sometimes, states tremors are worse     HISTORICAL  Ruth Charles is a 50 year old female, seen in request by her primary care physician Dr. Laurann Charles, Ruth Charles, for evaluation of unsteady gait, she is accompanied by her friend at today's visit on February 16, 2020.  I reviewed and summarized the referring note. Past medical history Asthma Anxiety Long history of Psoriatec arthritis DVT, right leg in 2018, was treated with Xarelto in the past, was considered due to birthcontrol side effect, also after prolonged airplane travel. She had a history of IVC filter placed in November 2018 following acute GI bleeding in the setting of anticoagulation, underlying gastritis, improved, later IV filter was taken out  She had long history of psoriasis, frequent skin eruption involving bilateral palms plantar surface.  Over the years, tried different medications, currently taking Stelara since January 2018, still has multiple nonhealing lesions at plantar surface, especially on the right side, palm,  She described burning sensitivity around the skin rash, significant pain, multiple joints pain due to sciatica arthritis, since July 2021, she also noticed intermittent bilateral hand posturing tremor, heightened with her skin lesion and joints pain, the paresthesia mainly centered around the skin rash, limitation of her daily activity due to her paresthesia, joint pain, gait abnormality due to psoriatic rash at bilateral plantar surface  She also has frequent asthma attacks, complains of unpredictable reaction with environmental challenge, often presented with nausea vomiting, diarrhea, difficulty breathing,  UPDATE Mar 21 2020: EMG/NCS showed no peripheral neuropathy, mild right carpal tunnel syndrome  She has tried gabapentin only 100 mg every night, does help her symptoms, also help her sleep  better  Personally reviewed MRI of cervical spine in September 2021: Multilevel degenerative changes, ill-defined spinal cord patchy hyperintensity from C 3-6 of unclear significance  She has mild gait abnormality, mainly attributed to her sole discomfort, pain, denies bowel and bladder incontinence, denies radiating pain to bilateral upper extremity  Update September 19, 2020 SS: Here today with wife, Ruth Charles. Saw rheumatologist 3/31, has open wounds on hands and feet, increased swelling to hands, flare since at least 6 months.   Hand tremor is okay today, took Xanax before today. No tremor when no rash or flare going on. Has extreme fatigue, is on disability.   On gabapentin 300 mg 3 times daily. Helps to lessen the pain from burning, pins and needles to hands and feet, helps to sleep at night.   Extensive laboratory evaluation in October 2021 showed normal CRP, HIV, RPR, B 12, A1C 4.3, sed rate, MM panel, Vit D.  Hepatitis panel was negative, ANA negative; copper was low 64 (80-158). CMP showed elevated AST 158, ALT 94; CBC showed increased MCV 109. In October, elevated of labs could be from ETOH, asked to stop drinking.   Had blood work done at [facility] dermatology 08/30/20, able to review: Liver function is improved AST 66, ALT 59, alk phos 126, albumin 3.9 ( 3.8-4.9), glucose 142, creatinine 0.93, GFR 74.  WBC 8.2, Hgb 12.9, MCV elevated 107 (79-97), MCH 38.2 (26.6-33.0), MCHC 35.6 (31.5-35.7), platelets 228.  She drinks, 2 glasses of wine a day, bourbon,2-3 glasses. Calms her down to help with pain, and to help sleep. Thinks this is probably too much.   Continues with gait instability, unsteadiness, especially with open sores on her feet.  No falls.  Complains of neck, left  shoulder pain.  Denies weakness, but feels the left side is problematic (even more sinus issues).   REVIEW OF SYSTEMS: Full 14 system review of systems performed and notable only for as above  See  HPI  ALLERGIES: Allergies  Allergen Reactions  . Corn-Containing Products Other (See Comments)    Chest congestion  . Gluten Meal Other (See Comments)    Nausea and diarrhea  . Lactose Intolerance (Gi) Other (See Comments)    Chest congestion  . Remicade [Infliximab] Shortness Of Breath and Other (See Comments)    "Lightheadedness" also  . Cosentyx [Secukinumab] Other (See Comments)    Worsened symptoms  . Enbrel [Etanercept] Other (See Comments)    Psoriasis worsened  . Humira [Adalimumab] Other (See Comments)    Psoriasis worsened  . Sulfa Antibiotics Hives  . Tape Other (See Comments)    TEARS THE SKIN!! COBAN WRAP IS TOLERATED  . Latex Rash    HOME MEDICATIONS: Current Outpatient Medications  Medication Sig Dispense Refill  . albuterol (PROVENTIL HFA;VENTOLIN HFA) 108 (90 BASE) MCG/ACT inhaler Inhale 2 puffs into the lungs every 6 (six) hours as needed for wheezing or shortness of breath.     . ALPRAZolam (XANAX) 0.25 MG tablet Take 0.25 mg by mouth 3 (three) times daily as needed for anxiety.    . budesonide-formoterol (SYMBICORT) 160-4.5 MCG/ACT inhaler Inhale 2 puffs into the lungs 2 (two) times daily.    . calcium carbonate (TUMS - DOSED IN MG ELEMENTAL CALCIUM) 500 MG chewable tablet Chew 1 tablet by mouth as needed for indigestion or heartburn.    . cetirizine (ZYRTEC) 10 MG tablet Take 10 mg by mouth daily.    . Cholecalciferol (VITAMIN D3) 1.25 MG (50000 UT) TABS Take by mouth every 7 (seven) days.    [name] desonide (DESOWEN) 0.05 % cream Apply 1 application topically 2 (two) times daily as needed (for psoriasis).    . diphenhydrAMINE (BENADRYL) 25 MG tablet Take 25 mg every 6 (six) hours as needed by mouth (for "back up" if Zyrtec is not effective).     . fluticasone (FLONASE) 50 MCG/ACT nasal spray Place 1 spray into both nostrils daily.    [name] gabapentin (NEURONTIN) 300 MG capsule Take 1 capsule (300 mg total) by mouth 3 (three) times daily. 90 capsule 11  . montelukast  (SINGULAIR) 10 MG tablet Take 10 mg by mouth at bedtime.    [name] olopatadine (PATANOL) 0.1 % ophthalmic solution 1 drop 2 (two) times daily.    . Olopatadine HCl 0.6 % SOLN Place 2 sprays into the nose daily.    . pantoprazole (PROTONIX) 40 MG tablet Take 1 tablet (40 mg total) by mouth 2 (two) times daily before a meal. 60 tablet 0  . potassium chloride SA (K-DUR,KLOR-CON) 20 MEQ tablet Take 1 tablet (20 mEq total) by mouth daily. (Patient taking differently: Take 20 mEq by mouth daily as needed (with Lasix).) 7 tablet 0  . pseudoephedrine (SUDAFED) 30 MG tablet Take 30 mg by mouth every 4 (four) hours as needed for congestion.    . sertraline (ZOLOFT) 50 MG tablet 1 tablet    . sucralfate (CARAFATE) 1 GM/10ML suspension Take 10 mLs (1 g total) by mouth 2 (two) times daily. 280 mL 0  . tacrolimus (PROTOPIC) 0.1 % ointment Apply topically 2 (two) times daily.    [name] triamcinolone ointment (KENALOG) 0.1 % Apply 1 application topically 2 (two) times daily as needed (for psoriasis).    [name] UNABLE TO FIND  Med Name: clobetazol cream    . ustekinumab (STELARA) 90 MG/ML SOSY injection Inject 90 mg into the skin every 3 (three) months.     No current facility-administered medications for this visit.    PAST MEDICAL HISTORY: Past Medical History:  Diagnosis Date  . Arthritis   . Asthma   . Bronchitis    hx of   . Chronic deep vein thrombosis (DVT) of popliteal vein of right lower extremity (HCC)   . Complication of anesthesia   . Cough   . Diarrhea   . Fatigue   . Frequent urination at night   . GERD (gastroesophageal reflux disease)   . Gluten intolerance   . Lactose intolerance   . Mumps    hx of   . Night sweats   . Polyarthralgia   . PONV (postoperative nausea and vomiting)   . Psoriasis   . Psoriatic arthritis ([place])   . Seasonal allergies   . Shortness of breath dyspnea   . Streptococcal infection    hx of   . Tinnitus   . Tremor   . Wheezing     PAST SURGICAL HISTORY: Past  Surgical History:  Procedure Laterality Date  . APPENDECTOMY     2004  . BREAST SURGERY     breast biopsy left   . ESOPHAGOGASTRODUODENOSCOPY N/A 04/22/2017   Procedure: ESOPHAGOGASTRODUODENOSCOPY (EGD);  Surgeon: Ronnette Juniper, MD;  Location: [facility];  Service: Gastroenterology;  Laterality: N/A;  . IR IVC FILTER PLMT / S&I /IMG GUID/MOD SED  04/22/2017  . IR IVC FILTER RETRIEVAL / S&I /IMG GUID/MOD SED  07/21/2017  . IR RADIOLOGIST EVAL & MGMT  06/25/2017  . PATELLA-FEMORAL ARTHROPLASTY Left 11/06/2014   Procedure: LEFT KNEE PATELLA-FEMORAL ARTHROPLASTY;  Surgeon: Paralee Cancel, MD;  Location: [facility];  Service: Orthopedics;  Laterality: Left;  . removal right ovary with cyst     2004  . right TMJ     2002    FAMILY HISTORY: Family History  Problem Relation Age of Onset  . Fibromyalgia Mother   . Irritable bowel syndrome Mother   . Diabetes Mellitus II Maternal Uncle   . Cancer Maternal Uncle   . Hypertension Maternal Grandmother   . Hypertension Paternal Grandmother   . Hypertension Paternal Grandfather     SOCIAL HISTORY: Social History   Socioeconomic History  . Marital status: Married    Spouse name: Gerald Stabs  . Number of children: 0  . Years of education: Not on file  . Highest education level: Bachelor's degree (e.g., BA, AB, BS)  Occupational History    Comment: teacher retired  Tobacco Use  . Smoking status: Never Smoker  . Smokeless tobacco: Never Used  Vaping Use  . Vaping Use: Never used  Substance and Sexual Activity  . Alcohol use: Yes    Comment: wine and liquor 5-7 glasses per week   . Drug use: Never  . Sexual activity: Not on file  Other Topics Concern  . Not on file  Social History Narrative   Lives with souse   Caffeine 1-2 daily   Social Determinants of Health   Financial Resource Strain: Not on file  Food Insecurity: Not on file  Transportation Needs: Not on file  Physical Activity: Not on file  Stress: Not on file  Social Connections:  Not on file  Intimate Partner Violence: Not on file   PHYSICAL EXAM   Vitals:   09/19/20 1045  BP: 124/80  Pulse: 86  Weight: 141  lb (64 kg)  Height: 5' 3"  (1.6 m)   Not recorded     Body mass index is 24.98 kg/m.  PHYSICAL EXAMNIATION:  Gen: NAD, conversant, well nourised, well groomed  NEUROLOGICAL EXAM:  MENTAL STATUS: Speech/cognition: Awake, alert, oriented to history taking and casual conversation   CRANIAL NERVES: CN II: Visual fields are full to confrontation. Pupils are round equal and briskly reactive to light. CN III, IV, VI: extraocular movement are normal. No ptosis. CN V: Facial sensation is intact to light touch CN VII: Face is symmetric with normal eye closure  CN VIII: Hearing is normal to causal conversation. CN IX, X: Phonation is normal. CN XI: Head turning and shoulder shrug are intact  MOTOR: Good strength of all extremities, hands are swollen, blistered, edematous, wrapped in gauze, covered with gloves, up to elbow  REFLEXES: Reflexes are 2+ and symmetric at the biceps, triceps, 3/3 knees,   SENSORY: Intact to light touch  COORDINATION: Mild tremor with finger-nose-finger bilaterally, heel-to-shin is normal  GAIT/STANCE: Mildly antalgic due to rash on feet  DIAGNOSTIC DATA (LABS, IMAGING, TESTING) - I reviewed patient records, labs, notes, testing and imaging myself where available.  ASSESSMENT AND PLAN  Ruth Charles is a 50 y.o. female   1. Intermittent bilateral hands and feet paresthesia  -Hyperreflexia on examinations  -MRI of the cervical showed degenerative changes, ill-defined patchy area of signal abnormality from C3-C6,  -EMG nerve conduction study in October 2021 showed no evidence of large fiber peripheral neuropathy, mild right carpal tunnel syndromes  -Her complaints of bilateral hands and feet paresthesia most likely due to her psoriatic skin rash, and not sure the described abnormal findings on the MRI  cervical spine represent true pathology, versus artifact  -Extensive laboratory evaluation in October 2021 showed normal CRP, HIV, RPR, B 12, A1C 4.3, sed rate, MM panel, Vit D.  Hepatitis panel was negative, ANA negative; copper was low 64 (80-158). CMP showed elevated AST 158, ALT 94; CBC showed increased MCV 109  -Had blood work done at [facility] dermatology 08/30/20, able to review: Liver function is improved AST 66, ALT 59, alk phos 126, albumin 3.9 ( 3.8-4.9), glucose 142, creatinine 0.93, GFR 74.  WBC 8.2, Hgb 12.9, MCV elevated 107 (79-97), MCH 38.2 (26.6-33.0), MCHC 35.6 (31.5-35.7), platelets 228.  -Will discuss with Dr. Krista Blue next week, had planned to repeat MRI of cervical spine with and without contrast in September 2022, which would be a year from initial scan, consider going ahead to repeat  -We have discussed at the least a reduction in alcohol, if not a complete elimination, liver enzymes are improved   -Continue gabapentin 300 mg 3 times daily  -Will provide follow-up once MRI schedule is set  Addendum 09/24/20 SS: reviewed with Dr. Krista Blue go ahead and repeat MRI of cervical spine with and without contrast for comparison. Creatinine 08/30/20 0.93. I called and left her a message.   I spent 30 minutes of face-to-face and non-face-to-face time with patient.  This included previsit chart review, lab review, study review, order entry, electronic health record documentation, patient education.  Evangeline Dakin, DNP  [facility] [address], [place], [place] [address] [phone]

## 2020-09-19 ENCOUNTER — Encounter: Payer: Self-pay | Admitting: Neurology

## 2020-09-19 ENCOUNTER — Ambulatory Visit (INDEPENDENT_AMBULATORY_CARE_PROVIDER_SITE_OTHER): Payer: Managed Care, Other (non HMO) | Admitting: Neurology

## 2020-09-19 VITALS — BP 124/80 | HR 86 | Ht 63.0 in | Wt 141.0 lb

## 2020-09-19 DIAGNOSIS — R937 Abnormal findings on diagnostic imaging of other parts of musculoskeletal system: Secondary | ICD-10-CM | POA: Diagnosis not present

## 2020-09-19 DIAGNOSIS — R202 Paresthesia of skin: Secondary | ICD-10-CM

## 2020-09-19 NOTE — Patient Instructions (Signed)
I'll call you next week about MRI plans Continue gabapentin

## 2020-09-24 ENCOUNTER — Telehealth: Payer: Self-pay | Admitting: Neurology

## 2020-09-24 NOTE — Telephone Encounter (Signed)
Medicare/cigna order sent to GI. They will obtain the auth for cigna and reach out to the patient to schedule.  

## 2020-09-24 NOTE — Addendum Note (Signed)
Addended by: Glean Salvo on: 09/24/2020 08:31 AM   Modules accepted: Orders

## 2020-10-01 ENCOUNTER — Other Ambulatory Visit: Payer: Managed Care, Other (non HMO)

## 2020-10-01 NOTE — Telephone Encounter (Signed)
[employer] did not approve the MRI Cervical spine.   Based on evicore guidelines. We cannot approve this request. Your records show that you have changes in the feeling in your arms and/or hands this request cannot be approved because: your records do not show physical exam results that show a reason this study is needed. This exam should include a detailed nervous system ( a network of nerves that send nerve messages between parts of your body) exam.   Imaging requires six weeks of provider directed treatment to be completed. Supported treatments include (but are not limited to) drugs for swelling or pain, an in office workout ( physical therapy), and/or oral or injected steroids. This must have been complotted in the past three months without improved symptoms. Contact (via office visit, phone, email or messaging) must occur after the treatment is completed.   There is an option to do a peer to peer the phone number is [phone] option 4. They informed me there is no deadline for the peer to peer it just needs to be scheduled to be done.The case number is [id number].

## 2020-10-09 NOTE — Telephone Encounter (Signed)
MRI of cervical spine is approved, [id number].

## 2020-10-09 NOTE — Telephone Encounter (Signed)
Noted, thank you! I sent the order to GI they will reach out to the patient to r/s.

## 2020-10-11 ENCOUNTER — Ambulatory Visit
Admission: RE | Admit: 2020-10-11 | Discharge: 2020-10-11 | Disposition: A | Discharge status: Home / Self Care | Payer: Managed Care, Other (non HMO) | Source: Ambulatory Visit | Attending: Neurology | Admitting: Neurology

## 2020-10-11 ENCOUNTER — Other Ambulatory Visit: Payer: Self-pay

## 2020-10-11 DIAGNOSIS — R937 Abnormal findings on diagnostic imaging of other parts of musculoskeletal system: Secondary | ICD-10-CM

## 2020-10-11 DIAGNOSIS — R202 Paresthesia of skin: Secondary | ICD-10-CM | POA: Diagnosis not present

## 2020-10-11 MED ORDER — GADOBENATE DIMEGLUMINE 529 MG/ML IV SOLN
13.0000 mL | Freq: Once | INTRAVENOUS | Status: AC | PRN
  Administered 2020-10-11: 13 mL via INTRAVENOUS

## 2020-10-15 ENCOUNTER — Telehealth: Payer: Self-pay | Admitting: Neurology

## 2020-10-15 NOTE — Telephone Encounter (Signed)
02-14-21 at 0730 Dr. Terrace Arabia. 4 month RV.

## 2020-10-15 NOTE — Telephone Encounter (Signed)
I called pt, she states that she had last Thursday her MRI with IV contrast. She noted on Saturday that the R [facility] was swollen, red, painful and creeping up to her shoulder.  I relayed that she should see urgent care to assess. She has appt with her dermatologist this week but unavailable now.  She appreciated call back and will go to urgent care.

## 2020-10-15 NOTE — Telephone Encounter (Signed)
Reviewed MRI of cervical spine with Dr. Terrace Arabia, hyperintense signal was felt to be artifact on recent scan.  There is overall no nerve root compression or significant stenosis. Please schedule follow-up with Dr. Terrace Arabia in 4 months to check in.   IMPRESSION: This MRI of the cervical spine with and without contrast shows the following: 1.   The spinal cord appears normal.  There is minimal patchy hyperintense signal noted on the sagittal STIR images if not confirmed on other images and likely represents artifact.  The spinal cord has normal enhancement pattern. 2.    Multilevel degenerative changes as detailed above.  For the most part, these degenerative changes are stable compared to the 2021 MRI.  However, some edema has developed at the C6-C7 endplates implying ongoing changes. 3.    There is no nerve root compression or significant spinal stenosis noted. 4.    Mild cerebellar atrophy, mostly midline.  This is of unknown etiology or significance. 5.    Normal enhancement pattern.

## 2020-10-15 NOTE — Telephone Encounter (Signed)
Pt states she is having a reaction to the contrast  from MRI she wants to know  if Dr Terrace Arabia could address this or if she needs to see her PCP.  Pt states she believes it is cellulitis

## 2020-10-16 NOTE — Telephone Encounter (Signed)
I called pt gave her results of MRI C spine per SS/NP " Reviewed MRI of cervical spine with Dr. Terrace Arabia, hyperintense signal was felt to be artifact on recent scan.  There is overall no nerve root compression or significant stenosis. Please schedule follow-up with Dr. Terrace Arabia in 4 months to check in".  Made appt 02-14-21 at 0730 with Dr. Terrace Arabia. Pt verbalized understanding.

## 2021-02-14 ENCOUNTER — Ambulatory Visit (INDEPENDENT_AMBULATORY_CARE_PROVIDER_SITE_OTHER): Payer: Managed Care, Other (non HMO) | Admitting: Neurology

## 2021-02-14 ENCOUNTER — Encounter: Payer: Self-pay | Admitting: Neurology

## 2021-02-14 VITALS — BP 134/82 | HR 71 | Ht 63.0 in | Wt 132.0 lb

## 2021-02-14 DIAGNOSIS — R937 Abnormal findings on diagnostic imaging of other parts of musculoskeletal system: Secondary | ICD-10-CM

## 2021-02-14 DIAGNOSIS — R790 Abnormal level of blood mineral: Secondary | ICD-10-CM | POA: Diagnosis not present

## 2021-02-14 DIAGNOSIS — R202 Paresthesia of skin: Secondary | ICD-10-CM | POA: Diagnosis not present

## 2021-02-14 NOTE — Progress Notes (Addendum)
Chief Complaint  Patient presents with   Follow-up    [name], alone, states she is stable, no new concerns    ASSESSMENT AND PLAN  Ruth Charles is a 50 y.o. female   Intermittent bilateral hands and feet paresthesia  EMG nerve conduction study in October 2021 showed no evidence of large fiber peripheral neuropathy, mild right carpal tunnel syndromes  MRI of cervical spine in October 2021 showed questionable ill-defined cord signal abnormality, on repeat MRI of cervical spine in May 2022, spinal cord was normal, multilevel degenerative changes but no significant canal or foraminal stenosis,  Previous extensive laboratory evaluation showed low copper level 64, will repeat test,  No evidence of active neurological issues, but complains of bilateral hands and feet paresthesia mainly centered around her psoriasis skin lesions, gabapentin as needed was helpful  Poorly controlled psoriasis, with psoriasis arthritis,  Continue follow-up with rheumatologist,  Low Copper -level  Repeat serum copper  DIAGNOSTIC DATA (LABS, IMAGING, TESTING) - I reviewed patient records, labs, notes, testing and imaging myself where available.   HISTORICAL  Ruth Charles is a 50 year old female, seen in request by her primary care physician Dr. Laurann Charles, Ruth Charles, for evaluation of unsteady gait, she is accompanied by her friend at today's visit on February 16, 2020.  I reviewed and summarized the referring note. Past medical history Asthma Anxiety Long history of Psoriatec arthritis DVT, right leg in 2018, was treated with Xarelto in the past, was considered due to birthcontrol side effect, also after prolonged airplane travel. She had a history of IVC filter placed in November 2018 following acute GI bleeding in the setting of anticoagulation, underlying gastritis, improved, later IV filter was taken out  She had long history of psoriasis, frequent skin eruption involving bilateral palms plantar  surface.  Over the years, tried different medications, currently taking Stelara since January 2018, still has multiple nonhealing lesions at plantar surface, especially on the right side, palm,  She described burning sensitivity around the skin rash, significant pain, multiple joints pain due to sciatica arthritis, since July 2021, she also noticed intermittent bilateral hand posturing tremor, heightened with her skin lesion and joints pain, the paresthesia mainly centered around the skin rash, limitation of her daily activity due to her paresthesia, joint pain, gait abnormality due to psoriatic rash at bilateral plantar surface  She also has frequent asthma attacks, hands and feet unpredictable reaction with environmental challenge, often presented with nausea vomiting, diarrhea, difficulty breathing,  UPDATE Mar 21 2020: EMG/NCS showed no large fiber peripheral neuropathy, mild right carpal tunnel syndrome  She has tried gabapentin only 100 mg every night, does help her symptoms, also help her sleep better  Personally reviewed MRI of cervical spine in September 2021: Multilevel degenerative changes, ill-defined spinal cord patchy hyperintensity from C 3-6 of unclear significance  She has mild gait abnormality, mainly attributed to her sole discomfort, pain, denies bowel and bladder incontinence, denies radiating pain to bilateral upper extremity  UPDATE Sept 15 2022: She continue combating her psoriasis, multiple skin lesions in her hands, bottom of her feet, painful paresthesia around the skin lesion,  We personally reviewed repeat MRI of cervical spine in May 2022, spinal cord has normal signal, multilevel degenerative changes no significant canal or foraminal narrowing.  Extensive laboratory evaluation in October 2021 showed normal CRP, HIV, RPR, B 12, A1C 4.3, sed rate, MM panel, Vit D.  Hepatitis panel was negative, ANA negative; copper was low 64 (80-158). CMP showed elevated  AST 158, ALT  94; CBC showed increased MCV 109. In October, elevated of labs could be from ETOH, asked to stop drinking.   Had blood work done at [facility] 08/30/20, able to review: Liver function is improved AST 66, ALT 59, alk phos 126, albumin 3.9 ( 3.8-4.9), glucose 142, creatinine 0.93, GFR 74.  WBC 8.2, Hgb 12.9, MCV elevated 107 (79-97), MCH 38.2 (26.6-33.0), MCHC 35.6 (31.5-35.7), platelets 228.  She drinks, 2 glasses of wine a day, bourbon,2-3 glasses. Calms her down to help with pain, and to help sleep.  PHYSICAL EXAM   Vitals:   02/14/21 0738  BP: 134/82  Pulse: 71  Weight: 132 lb (59.9 kg)  Height: 5' 3" (1.6 m)   Not recorded     Body mass index is 23.38 kg/m.  PHYSICAL EXAMNIATION:  Gen: NAD, conversant, well nourised, well groomed  NEUROLOGICAL EXAM:  MENTAL STATUS: Speech/cognition: Awake, alert, oriented to history taking and casual conversation   CRANIAL NERVES: CN II: Visual fields are full to confrontation. Pupils are round equal and briskly reactive to light. CN III, IV, VI: extraocular movement are normal. No ptosis. CN V: Facial sensation is intact to light touch CN VII: Face is symmetric with normal eye closure  CN VIII: Hearing is normal to causal conversation. CN IX, X: Phonation is normal. CN XI: Head turning and shoulder shrug are intact  MOTOR: Good strength of all extremities, hands are swollen, bilateral lower extremity erythematous, swelling from the mid shin down  REFLEXES: Reflexes are 1 and symmetric at the biceps, triceps, 3/3 knees,   SENSORY: Intact to light touch  COORDINATION: Mild tremor with finger-nose-finger bilaterally, heel-to-shin is normal  GAIT/STANCE: Mildly antalgic due to rash on feet  REVIEW OF SYSTEMS: Full 14 system review of systems performed and notable only for as above  See HPI  ALLERGIES: Allergies  Allergen Reactions   Corn-Containing Products Other (See Comments)    Chest congestion   Gluten  Meal Other (See Comments)    Nausea and diarrhea   Lactose Intolerance (Gi) Other (See Comments)    Chest congestion   Remicade [Infliximab] Shortness Of Breath and Other (See Comments)    "Lightheadedness" also   Cosentyx [Secukinumab] Other (See Comments)    Worsened symptoms   Enbrel [Etanercept] Other (See Comments)    Psoriasis worsened   Humira [Adalimumab] Other (See Comments)    Psoriasis worsened   Sulfa Antibiotics Hives   Tape Other (See Comments)    TEARS THE SKIN!! COBAN WRAP IS TOLERATED   Latex Rash    HOME MEDICATIONS: Current Outpatient Medications  Medication Sig Dispense Refill   albuterol (PROVENTIL HFA;VENTOLIN HFA) 108 (90 BASE) MCG/ACT inhaler Inhale 2 puffs into the lungs every 6 (six) hours as needed for wheezing or shortness of breath.      ALPRAZolam (XANAX) 0.25 MG tablet Take 0.25 mg by mouth 3 (three) times daily as needed for anxiety.     budesonide-formoterol (SYMBICORT) 160-4.5 MCG/ACT inhaler Inhale 2 puffs into the lungs 2 (two) times daily.     calcium carbonate (TUMS - DOSED IN MG ELEMENTAL CALCIUM) 500 MG chewable tablet Chew 1 tablet by mouth as needed for indigestion or heartburn.     cetirizine (ZYRTEC) 10 MG tablet Take 10 mg by mouth daily.     Cholecalciferol (VITAMIN D3) 1.25 MG (50000 UT) TABS Take by mouth every 7 (seven) days.     desonide (DESOWEN) 0.05 % cream Apply 1 application topically 2 (two)   times daily as needed (for psoriasis).     diphenhydrAMINE (BENADRYL) 25 MG tablet Take 25 mg every 6 (six) hours as needed by mouth (for "back up" if Zyrtec is not effective).      fluticasone (FLONASE) 50 MCG/ACT nasal spray Place 1 spray into both nostrils daily.     gabapentin (NEURONTIN) 300 MG capsule Take 1 capsule (300 mg total) by mouth 3 (three) times daily. 90 capsule 11   montelukast (SINGULAIR) 10 MG tablet Take 10 mg by mouth at bedtime.     olopatadine (PATANOL) 0.1 % ophthalmic solution 1 drop 2 (two) times daily.      Olopatadine HCl 0.6 % SOLN Place 2 sprays into the nose daily.     pantoprazole (PROTONIX) 40 MG tablet Take 1 tablet (40 mg total) by mouth 2 (two) times daily before a meal. 60 tablet 0   potassium chloride SA (K-DUR,KLOR-CON) 20 MEQ tablet Take 1 tablet (20 mEq total) by mouth daily. (Patient taking differently: Take 20 mEq by mouth daily as needed (with Lasix).) 7 tablet 0   pseudoephedrine (SUDAFED) 30 MG tablet Take 30 mg by mouth every 4 (four) hours as needed for congestion.     sertraline (ZOLOFT) 50 MG tablet 1 tablet     sucralfate (CARAFATE) 1 GM/10ML suspension Take 10 mLs (1 g total) by mouth 2 (two) times daily. 280 mL 0   tacrolimus (PROTOPIC) 0.1 % ointment Apply topically 2 (two) times daily.     triamcinolone ointment (KENALOG) 0.1 % Apply 1 application topically 2 (two) times daily as needed (for psoriasis).     UNABLE TO FIND Med Name: clobetazol cream     ustekinumab (STELARA) 90 MG/ML SOSY injection Inject 90 mg into the skin every 3 (three) months.     No current facility-administered medications for this visit.    PAST MEDICAL HISTORY: Past Medical History:  Diagnosis Date   Arthritis    Asthma    Bronchitis    hx of    Chronic deep vein thrombosis (DVT) of popliteal vein of right lower extremity (HCC)    Complication of anesthesia    Cough    Diarrhea    Fatigue    Frequent urination at night    GERD (gastroesophageal reflux disease)    Gluten intolerance    Lactose intolerance    Mumps    hx of    Night sweats    Polyarthralgia    PONV (postoperative nausea and vomiting)    Psoriasis    Psoriatic arthritis (HCC)    Seasonal allergies    Shortness of breath dyspnea    Streptococcal infection    hx of    Tinnitus    Tremor    Wheezing     PAST SURGICAL HISTORY: Past Surgical History:  Procedure Laterality Date   APPENDECTOMY     2004   BREAST SURGERY     breast biopsy left    ESOPHAGOGASTRODUODENOSCOPY N/A 04/22/2017   Procedure:  ESOPHAGOGASTRODUODENOSCOPY (EGD);  Surgeon: Karki, Arya, MD;  Location: [facility];  Service: Gastroenterology;  Laterality: N/A;   IR IVC FILTER PLMT / S&I /IMG GUID/MOD SED  04/22/2017   IR IVC FILTER RETRIEVAL / S&I /IMG GUID/MOD SED  07/21/2017   IR RADIOLOGIST EVAL & MGMT  06/25/2017   PATELLA-FEMORAL ARTHROPLASTY Left 11/06/2014   Procedure: LEFT KNEE PATELLA-FEMORAL ARTHROPLASTY;  Surgeon: Matthew Olin, MD;  Location: [facility];  Service: Orthopedics;  Laterality: Left;   removal right ovary with   cyst     2004   right TMJ     2002    FAMILY HISTORY: Family History  Problem Relation Age of Onset   Fibromyalgia Mother    Irritable bowel syndrome Mother    Diabetes Mellitus II Maternal Uncle    Cancer Maternal Uncle    Hypertension Maternal Grandmother    Hypertension Paternal Grandmother    Hypertension Paternal Grandfather     SOCIAL HISTORY: Social History   Socioeconomic History   Marital status: Married    Spouse name: Chris   Number of children: 0   Years of education: Not on file   Highest education level: Bachelor's degree (e.g., BA, AB, BS)  Occupational History    Comment: teacher retired  Tobacco Use   Smoking status: Never   Smokeless tobacco: Never  Vaping Use   Vaping Use: Never used  Substance and Sexual Activity   Alcohol use: Yes    Comment: wine and liquor 5-7 glasses per week    Drug use: Never   Sexual activity: Not on file  Other Topics Concern   Not on file  Social History Narrative   Lives with souse   Caffeine 1-2 daily   Social Determinants of Health   Financial Resource Strain: Not on file  Food Insecurity: Not on file  Transportation Needs: Not on file  Physical Activity: Not on file  Stress: Not on file  Social Connections: Not on file  Intimate Partner Violence: Not on file     Yijun Yan, M.D. Ph.D.  [facility] [address] [place], [place] [address] Phone: [phone] Fax:      [phone]   

## 2021-05-29 ENCOUNTER — Other Ambulatory Visit: Payer: Self-pay | Admitting: Neurology

## 2021-05-29 NOTE — Telephone Encounter (Signed)
Rx refilled.

## 2021-11-15 ENCOUNTER — Ambulatory Visit (INDEPENDENT_AMBULATORY_CARE_PROVIDER_SITE_OTHER): Payer: Managed Care, Other (non HMO) | Admitting: Podiatry

## 2021-11-15 DIAGNOSIS — L600 Ingrowing nail: Secondary | ICD-10-CM | POA: Diagnosis not present

## 2021-11-19 ENCOUNTER — Encounter: Payer: Self-pay | Admitting: Podiatry

## 2021-11-19 NOTE — Progress Notes (Signed)
Subjective:  Patient ID: Ruth Charles, female    DOB: 04-Aug-1970,  MRN: [id number]  Chief Complaint  Patient presents with   Ingrown Toenail    51 y.o. female presents with the above complaint.  Patient presents with bilateral medial border hallux ingrown.  Painful to touch.  Patient states that he is painful discomfort.  She would like to have removed.  She has not seen anyone else prior to seeing me.  She denies any other acute complaints.  Hurts with ambulation.   Review of Systems: Negative except as noted in the HPI. Denies N/V/F/Ch.  Past Medical History:  Diagnosis Date   Arthritis    Asthma    Bronchitis    hx of    Chronic deep vein thrombosis (DVT) of popliteal vein of right lower extremity (HCC)    Complication of anesthesia    Cough    Diarrhea    Fatigue    Frequent urination at night    GERD (gastroesophageal reflux disease)    Gluten intolerance    Lactose intolerance    Mumps    hx of    Night sweats    Polyarthralgia    PONV (postoperative nausea and vomiting)    Psoriasis    Psoriatic arthritis (HCC)    Seasonal allergies    Shortness of breath dyspnea    Streptococcal infection    hx of    Tinnitus    Tremor    Wheezing     Current Outpatient Medications:    albuterol (PROVENTIL HFA;VENTOLIN HFA) 108 (90 BASE) MCG/ACT inhaler, Inhale 2 puffs into the lungs every 6 (six) hours as needed for wheezing or shortness of breath. , Disp: , Rfl:    ALPRAZolam (XANAX) 0.25 MG tablet, Take 0.25 mg by mouth 3 (three) times daily as needed for anxiety., Disp: , Rfl:    budesonide-formoterol (SYMBICORT) 160-4.5 MCG/ACT inhaler, Inhale 2 puffs into the lungs 2 (two) times daily., Disp: , Rfl:    calcium carbonate (TUMS - DOSED IN MG ELEMENTAL CALCIUM) 500 MG chewable tablet, Chew 1 tablet by mouth as needed for indigestion or heartburn., Disp: , Rfl:    cetirizine (ZYRTEC) 10 MG tablet, Take 10 mg by mouth daily., Disp: , Rfl:    Cholecalciferol (VITAMIN D3)  1.25 MG (50000 UT) TABS, Take by mouth every 7 (seven) days., Disp: , Rfl:    desonide (DESOWEN) 0.05 % cream, Apply 1 application topically 2 (two) times daily as needed (for psoriasis)., Disp: , Rfl:    diphenhydrAMINE (BENADRYL) 25 MG tablet, Take 25 mg every 6 (six) hours as needed by mouth (for "back up" if Zyrtec is not effective). , Disp: , Rfl:    fluticasone (FLONASE) 50 MCG/ACT nasal spray, Place 1 spray into both nostrils daily., Disp: , Rfl:    gabapentin (NEURONTIN) 300 MG capsule, TAKE (1) CAPSULE THREE TIMES DAILY., Disp: 90 capsule, Rfl: 11   montelukast (SINGULAIR) 10 MG tablet, Take 10 mg by mouth at bedtime., Disp: , Rfl:    olopatadine (PATANOL) 0.1 % ophthalmic solution, 1 drop 2 (two) times daily., Disp: , Rfl:    Olopatadine HCl 0.6 % SOLN, Place 2 sprays into the nose daily., Disp: , Rfl:    pantoprazole (PROTONIX) 40 MG tablet, Take 1 tablet (40 mg total) by mouth 2 (two) times daily before a meal., Disp: 60 tablet, Rfl: 0   potassium chloride SA (K-DUR,KLOR-CON) 20 MEQ tablet, Take 1 tablet (20 mEq total) by mouth daily. (Patient  taking differently: Take 20 mEq by mouth daily as needed (with Lasix).), Disp: 7 tablet, Rfl: 0   pseudoephedrine (SUDAFED) 30 MG tablet, Take 30 mg by mouth every 4 (four) hours as needed for congestion., Disp: , Rfl:    sertraline (ZOLOFT) 50 MG tablet, 1 tablet, Disp: , Rfl:    sucralfate (CARAFATE) 1 GM/10ML suspension, Take 10 mLs (1 g total) by mouth 2 (two) times daily., Disp: 280 mL, Rfl: 0   tacrolimus (PROTOPIC) 0.1 % ointment, Apply topically 2 (two) times daily., Disp: , Rfl:    triamcinolone ointment (KENALOG) 0.1 %, Apply 1 application topically 2 (two) times daily as needed (for psoriasis)., Disp: , Rfl:    UNABLE TO FIND, Med Name: clobetazol cream, Disp: , Rfl:    ustekinumab (STELARA) 90 MG/ML SOSY injection, Inject 90 mg into the skin every 3 (three) months., Disp: , Rfl:   Social History   Tobacco Use  Smoking Status Never   Smokeless Tobacco Never    Allergies  Allergen Reactions   Corn-Containing Products Other (See Comments)    Chest congestion   Gluten Meal Other (See Comments)    Nausea and diarrhea   Lactose Intolerance (Gi) Other (See Comments)    Chest congestion   Remicade [Infliximab] Shortness Of Breath and Other (See Comments)    "Lightheadedness" also   Cosentyx [Secukinumab] Other (See Comments)    Worsened symptoms   Enbrel [Etanercept] Other (See Comments)    Psoriasis worsened   Humira [Adalimumab] Other (See Comments)    Psoriasis worsened   Sulfa Antibiotics Hives   Tape Other (See Comments)    TEARS THE SKIN!! COBAN WRAP IS TOLERATED   Latex Rash   Objective:  There were no vitals filed for this visit. There is no height or weight on file to calculate BMI. Constitutional Well developed. Well nourished.  Vascular Dorsalis pedis pulses palpable bilaterally. Posterior tibial pulses palpable bilaterally. Capillary refill normal to all digits.  No cyanosis or clubbing noted. Pedal hair growth normal.  Neurologic Normal speech. Oriented to person, place, and time. Epicritic sensation to light touch grossly present bilaterally.  Dermatologic Painful ingrowing nail at medial nail borders of the hallux nail bilaterally. No other open wounds. No skin lesions.  Orthopedic: Normal joint ROM without pain or crepitus bilaterally. No visible deformities. No bony tenderness.   Radiographs: None Assessment:   1. Ingrown toenail of right foot   2. Ingrown left big toenail    Plan:  Patient was evaluated and treated and all questions answered.  Ingrown Nail, bilaterally -Patient elects to proceed with minor surgery to remove ingrown toenail removal today. Consent reviewed and signed by patient. -Ingrown nail excised. See procedure note. -Educated on post-procedure care including soaking. Written instructions provided and reviewed. -Patient to follow up in 2 weeks for nail  check.  Procedure: Excision of Ingrown Toenail Location: Bilateral 1st toe medial nail borders. Anesthesia: Lidocaine 1% plain; 1.5 mL and Marcaine 0.5% plain; 1.5 mL, digital block. Skin Prep: Betadine. Dressing: Silvadene; telfa; dry, sterile, compression dressing. Technique: Following skin prep, the toe was exsanguinated and a tourniquet was secured at the base of the toe. The affected nail border was freed, split with a nail splitter, and excised. Chemical matrixectomy was then performed with phenol and irrigated out with alcohol. The tourniquet was then removed and sterile dressing applied. Disposition: Patient tolerated procedure well. Patient to return in 2 weeks for follow-up.   No follow-ups on file.

## 2021-11-20 ENCOUNTER — Telehealth: Payer: Self-pay | Admitting: *Deleted

## 2021-11-20 NOTE — Telephone Encounter (Signed)
Patient called because she has developed blistering around the nail bed of post procedural ingrown. Please advise.

## 2021-11-20 NOTE — Telephone Encounter (Signed)
Called patient to give recommendations per Dr Allena Katz, verbalized understanding.

## 2021-12-20 ENCOUNTER — Ambulatory Visit: Payer: Managed Care, Other (non HMO) | Admitting: Podiatry

## 2021-12-25 ENCOUNTER — Ambulatory Visit (INDEPENDENT_AMBULATORY_CARE_PROVIDER_SITE_OTHER): Payer: Medicare Other | Admitting: Podiatry

## 2021-12-25 DIAGNOSIS — L600 Ingrowing nail: Secondary | ICD-10-CM | POA: Diagnosis not present

## 2021-12-27 ENCOUNTER — Ambulatory Visit: Payer: Managed Care, Other (non HMO) | Admitting: Podiatry

## 2021-12-31 ENCOUNTER — Encounter: Payer: Self-pay | Admitting: Podiatry

## 2021-12-31 NOTE — Progress Notes (Signed)
Subjective:  Patient ID: Ruth Charles, female    DOB: 05/10/1971,  MRN: [id number]  Chief Complaint  Patient presents with   Ingrown Toenail    51 y.o. female presents with the above complaint.  Patient presents with complaint of right hallux lateral border ingrown.  Patient had the medial border removed previously by the outside/lateral border starting her.  Hurts with ambulation she would like to have removed she denies any other acute complaints she has not seen anyone else prior to seeing me for this.  Pain on palpation 7 out of 10   Review of Systems: Negative except as noted in the HPI. Denies N/V/F/Ch.  Past Medical History:  Diagnosis Date   Arthritis    Asthma    Bronchitis    hx of    Chronic deep vein thrombosis (DVT) of popliteal vein of right lower extremity (HCC)    Complication of anesthesia    Cough    Diarrhea    Fatigue    Frequent urination at night    GERD (gastroesophageal reflux disease)    Gluten intolerance    Lactose intolerance    Mumps    hx of    Night sweats    Polyarthralgia    PONV (postoperative nausea and vomiting)    Psoriasis    Psoriatic arthritis (HCC)    Seasonal allergies    Shortness of breath dyspnea    Streptococcal infection    hx of    Tinnitus    Tremor    Wheezing     Current Outpatient Medications:    albuterol (PROVENTIL HFA;VENTOLIN HFA) 108 (90 BASE) MCG/ACT inhaler, Inhale 2 puffs into the lungs every 6 (six) hours as needed for wheezing or shortness of breath. , Disp: , Rfl:    ALPRAZolam (XANAX) 0.25 MG tablet, Take 0.25 mg by mouth 3 (three) times daily as needed for anxiety., Disp: , Rfl:    budesonide-formoterol (SYMBICORT) 160-4.5 MCG/ACT inhaler, Inhale 2 puffs into the lungs 2 (two) times daily., Disp: , Rfl:    calcium carbonate (TUMS - DOSED IN MG ELEMENTAL CALCIUM) 500 MG chewable tablet, Chew 1 tablet by mouth as needed for indigestion or heartburn., Disp: , Rfl:    cetirizine (ZYRTEC) 10 MG tablet,  Take 10 mg by mouth daily., Disp: , Rfl:    Cholecalciferol (VITAMIN D3) 1.25 MG (50000 UT) TABS, Take by mouth every 7 (seven) days., Disp: , Rfl:    desonide (DESOWEN) 0.05 % cream, Apply 1 application topically 2 (two) times daily as needed (for psoriasis)., Disp: , Rfl:    diphenhydrAMINE (BENADRYL) 25 MG tablet, Take 25 mg every 6 (six) hours as needed by mouth (for "back up" if Zyrtec is not effective). , Disp: , Rfl:    fluticasone (FLONASE) 50 MCG/ACT nasal spray, Place 1 spray into both nostrils daily., Disp: , Rfl:    gabapentin (NEURONTIN) 300 MG capsule, TAKE (1) CAPSULE THREE TIMES DAILY., Disp: 90 capsule, Rfl: 11   montelukast (SINGULAIR) 10 MG tablet, Take 10 mg by mouth at bedtime., Disp: , Rfl:    olopatadine (PATANOL) 0.1 % ophthalmic solution, 1 drop 2 (two) times daily., Disp: , Rfl:    Olopatadine HCl 0.6 % SOLN, Place 2 sprays into the nose daily., Disp: , Rfl:    pantoprazole (PROTONIX) 40 MG tablet, Take 1 tablet (40 mg total) by mouth 2 (two) times daily before a meal., Disp: 60 tablet, Rfl: 0   potassium chloride SA (K-DUR,KLOR-CON) 20 MEQ  tablet, Take 1 tablet (20 mEq total) by mouth daily. (Patient taking differently: Take 20 mEq by mouth daily as needed (with Lasix).), Disp: 7 tablet, Rfl: 0   pseudoephedrine (SUDAFED) 30 MG tablet, Take 30 mg by mouth every 4 (four) hours as needed for congestion., Disp: , Rfl:    sertraline (ZOLOFT) 50 MG tablet, 1 tablet, Disp: , Rfl:    sucralfate (CARAFATE) 1 GM/10ML suspension, Take 10 mLs (1 g total) by mouth 2 (two) times daily., Disp: 280 mL, Rfl: 0   tacrolimus (PROTOPIC) 0.1 % ointment, Apply topically 2 (two) times daily., Disp: , Rfl:    triamcinolone ointment (KENALOG) 0.1 %, Apply 1 application topically 2 (two) times daily as needed (for psoriasis)., Disp: , Rfl:    UNABLE TO FIND, Med Name: clobetazol cream, Disp: , Rfl:    ustekinumab (STELARA) 90 MG/ML SOSY injection, Inject 90 mg into the skin every 3 (three)  months., Disp: , Rfl:   Social History   Tobacco Use  Smoking Status Never  Smokeless Tobacco Never    Allergies  Allergen Reactions   Corn-Containing Products Other (See Comments)    Chest congestion   Gluten Meal Other (See Comments)    Nausea and diarrhea   Lactose Intolerance (Gi) Other (See Comments)    Chest congestion   Remicade [Infliximab] Shortness Of Breath and Other (See Comments)    "Lightheadedness" also   Cosentyx [Secukinumab] Other (See Comments)    Worsened symptoms   Enbrel [Etanercept] Other (See Comments)    Psoriasis worsened   Humira [Adalimumab] Other (See Comments)    Psoriasis worsened   Sulfa Antibiotics Hives   Tape Other (See Comments)    TEARS THE SKIN!! COBAN WRAP IS TOLERATED   Latex Rash   Objective:  There were no vitals filed for this visit. There is no height or weight on file to calculate BMI. Constitutional Well developed. Well nourished.  Vascular Dorsalis pedis pulses palpable bilaterally. Posterior tibial pulses palpable bilaterally. Capillary refill normal to all digits.  No cyanosis or clubbing noted. Pedal hair growth normal.  Neurologic Normal speech. Oriented to person, place, and time. Epicritic sensation to light touch grossly present bilaterally.  Dermatologic Painful ingrowing nail at lateral nail borders of the hallux nail right. No other open wounds. No skin lesions.  Orthopedic: Normal joint ROM without pain or crepitus bilaterally. No visible deformities. No bony tenderness.   Radiographs: None Assessment:   1. Ingrown toenail of right foot    Plan:  Patient was evaluated and treated and all questions answered.  Ingrown Nail, right -Patient elects to proceed with minor surgery to remove ingrown toenail removal today. Consent reviewed and signed by patient. -Ingrown nail excised. See procedure note. -Educated on post-procedure care including soaking. Written instructions provided and reviewed. -Patient  to follow up in 2 weeks for nail check.  Procedure: Excision of Ingrown Toenail Location: Right 1st toe lateral nail borders. Anesthesia: Lidocaine 1% plain; 1.5 mL and Marcaine 0.5% plain; 1.5 mL, digital block. Skin Prep: Betadine. Dressing: Silvadene; telfa; dry, sterile, compression dressing. Technique: Following skin prep, the toe was exsanguinated and a tourniquet was secured at the base of the toe. The affected nail border was freed, split with a nail splitter, and excised. Chemical matrixectomy was then performed with phenol and irrigated out with alcohol. The tourniquet was then removed and sterile dressing applied. Disposition: Patient tolerated procedure well. Patient to return in 2 weeks for follow-up.   No follow-ups on file.

## 2022-03-04 ENCOUNTER — Other Ambulatory Visit: Payer: Self-pay | Admitting: Rheumatology

## 2022-03-04 DIAGNOSIS — R7989 Other specified abnormal findings of blood chemistry: Secondary | ICD-10-CM

## 2022-03-11 ENCOUNTER — Ambulatory Visit
Admission: RE | Admit: 2022-03-11 | Discharge: 2022-03-11 | Disposition: A | Discharge status: Home / Self Care | Payer: Managed Care, Other (non HMO) | Source: Ambulatory Visit | Attending: Rheumatology | Admitting: Rheumatology

## 2022-03-11 DIAGNOSIS — R7989 Other specified abnormal findings of blood chemistry: Secondary | ICD-10-CM

## 2022-03-13 ENCOUNTER — Encounter (HOSPITAL_BASED_OUTPATIENT_CLINIC_OR_DEPARTMENT_OTHER): Payer: Managed Care, Other (non HMO) | Attending: General Surgery | Admitting: General Surgery

## 2022-03-13 DIAGNOSIS — T24231A Burn of second degree of right lower leg, initial encounter: Secondary | ICD-10-CM | POA: Diagnosis present

## 2022-03-13 DIAGNOSIS — Y93G3 Activity, cooking and baking: Secondary | ICD-10-CM | POA: Diagnosis not present

## 2022-03-13 DIAGNOSIS — J4540 Moderate persistent asthma, uncomplicated: Secondary | ICD-10-CM | POA: Diagnosis not present

## 2022-03-13 DIAGNOSIS — X12XXXA Contact with other hot fluids, initial encounter: Secondary | ICD-10-CM | POA: Diagnosis not present

## 2022-03-13 DIAGNOSIS — L409 Psoriasis, unspecified: Secondary | ICD-10-CM | POA: Diagnosis not present

## 2022-03-13 NOTE — Progress Notes (Signed)
Ruth Charles, Ruth Charles ([id number]) [id number] Page 1 of 8 Visit Report for 03/13/2022 Chief Complaint Document Details Patient Name: Date of Service: Ruth Charles, Ruth Charles 03/13/2022 8:00 A M Medical Record Number: [id number] Patient Account Number: [id number] Date of Birth/Sex: Treating RN: 1970-10-17 (51 y.o. F) Primary Care Provider: Maurice Small Other Clinician: Referring Provider: Treating Provider/Extender: Eduard Roux in Treatment: 0 Information Obtained from: Patient Chief Complaint Patient presents to the wound care center with burn wound(s) to the right lower leg Electronic Signature(s) Signed: 03/13/2022 9:00:45 AM By: [name] MD FACS Previous Signature: 03/13/2022 8:38:20 AM Version By: [name] MD FACS Entered By: [name] on 03/13/2022 09:00:45 -------------------------------------------------------------------------------- HPI Details Patient Name: Date of Service: Ruth Charles, Ruth L. 03/13/2022 8:00 A M Medical Record Number: [id number] Patient Account Number: [id number] Date of Birth/Sex: Treating RN: 26-Jul-1970 (51 y.o. F) Primary Care Provider: Maurice Small Other Clinician: Referring Provider: Treating Provider/Extender: Eduard Roux in Treatment: 0 History of Present Illness HPI Description: ADMISSION 03/13/2022 This is a 51 year old woman with a past medical history significant for psoriasis/psoriatic arthritis, anxiety, neuropathy, and asthma. She was boiling water for tea on February 24, 2022 when she spilled the hot water onto her right lower leg and part of her dorsal foot. She suffered a superficial partial-thickness burn. Apparently Silvadene was applied in her doctor's office, but she did not receive a prescription. She has been treating it on her own at home using Xeroform gauze with an extra layer of petrolatum over the top of it and wrapping with  Kerlix. She has not had any fevers or chills. No purulent drainage from her burn. On the lateral aspect of her right lower leg starting at about the mid leg down onto the top of her foot, there is a very clean partial-thickness burn. There is old loose skin hanging from the edges, consistent with a blistering history. There is a little bit of slough and biofilm on the wound surface. No concern for infection. Electronic Signature(s) Signed: 03/13/2022 9:03:06 AM By: [name] MD FACS Previous Signature: 03/13/2022 8:38:59 AM Version By: [name] MD FACS Entered By: [name] on 03/13/2022 09:03:06 -------------------------------------------------------------------------------- Dressings and/or debridement of burns; small Details Patient Name: Date of Service: Ruth Charles, Ruth Richter. 03/13/2022 8:00 A Harvel Ricks ([id number]) [phone].pdf Page 2 of 8 Medical Record Number: [id number] Patient Account Number: [id number] Date of Birth/Sex: Treating RN: [birth year] (51 y.o. Katrinka Blazing Primary Care Provider: Maurice Small Other Clinician: Referring Provider: Treating Provider/Extender: Eduard Roux in Treatment: 0 Procedure Performed for: Wound #1 Right,Lateral Lower Leg Performed By: Physician [name], MD Post Procedure Diagnosis Same as Pre-procedure Notes Used scissors,forceps and curette. Removed slough and biofilm Scribed for Dr. Lady Gary by J.Scotton Electronic Signature(s) Signed: 03/13/2022 10:08:47 AM By: [name] MD FACS Signed: 03/13/2022 10:20:57 AM By: Karie Schwalbe RN Entered By: Karie Schwalbe on 03/13/2022 08:56:00 -------------------------------------------------------------------------------- Physical Exam Details Patient Name: Date of Service: Ruth Charles, Ruth L. 03/13/2022 8:00 A M Medical Record Number: [id number] Patient Account Number: [id number] Date of  Birth/Sex: Treating RN: 1970-12-29 (51 y.o. F) Primary Care Provider: Maurice Small Other Clinician: Referring Provider: Treating Provider/Extender: Eduard Roux in Treatment: 0 Constitutional . . . . No acute distress. Respiratory Normal work of breathing on room air.. Notes On the lateral aspect of her right lower leg starting at about the mid leg down onto the top of her foot,  there is a very clean partial-thickness burn. There is old loose skin hanging from the edges, consistent with a blistering history. There is a little bit of slough and biofilm on the wound surface. No concern for infection. Electronic Signature(s) Signed: 03/13/2022 9:03:15 AM By: [name] MD FACS Previous Signature: 03/13/2022 8:39:28 AM Version By: [name] MD FACS Entered By: [name] on 03/13/2022 09:03:15 -------------------------------------------------------------------------------- Physician Orders Details Patient Name: Date of Service: Ruth Charles, Ruth L. 03/13/2022 8:00 A M Medical Record Number: [id number] Patient Account Number: [id number] Date of Birth/Sex: Treating RN: 12-17-1970 (51 y.o. Katrinka Blazing Primary Care Provider: Maurice Small Other Clinician: Referring Provider: Treating Provider/Extender: Eduard Roux in Treatment: 0 Verbal / Phone Orders: No Diagnosis Coding [name], [name] ([id number]) [id number] Page 3 of 8 ICD-10 Coding Code Description T24.231A Burn of second degree of right lower leg, initial encounter L40.9 Psoriasis, unspecified J45.40 Moderate persistent asthma, uncomplicated Follow-up Appointments ppointment in 2 weeks. - Dr. Lady Gary Room 3 Return A Anesthetic (In clinic) Topical Lidocaine 4% applied to wound bed - used in clinic Bathing/ Shower/ Hygiene Other Bathing/Shower/Hygiene Orders/Instructions: - Change wound dressing after a shower Edema  Control - Lymphedema / SCD / Other void standing for long periods of time. - throughout the day A Wound Treatment Wound #1 - Lower Leg Wound Laterality: Right, Lateral Cleanser: Soap and Water 1 x Per Day/30 Days Discharge Instructions: May shower and wash wound with dial antibacterial soap and water prior to dressing change. Prim Dressing: Xeroform Occlusive Gauze Dressing, 4x4 in 1 x Per Day/30 Days ary Discharge Instructions: Apply to wound bed as instructed Secondary Dressing: T Non-Adherent Dressing, 3x4 in 1 x Per Day/30 Days elfa Discharge Instructions: Apply over primary dressing as directed. Secured With: [employer], 4.5x3.1 (in/yd) 1 x Per Day/30 Days Discharge Instructions: Secure with Kerlix as directed. Secured With: Paper Tape, 2x10 (in/yd) 1 x Per Day/30 Days Discharge Instructions: Secure dressing with tape as directed. Electronic Signature(s) Signed: 03/13/2022 10:08:47 AM By: [name] MD FACS Entered By: [name] on 03/13/2022 09:03:26 -------------------------------------------------------------------------------- Problem List Details Patient Name: Date of Service: Ruth Charles, Ruth L. 03/13/2022 8:00 A M Medical Record Number: [id number] Patient Account Number: [id number] Date of Birth/Sex: Treating RN: 03-09-1971 (51 y.o. F) Primary Care Provider: Maurice Small Other Clinician: Referring Provider: Treating Provider/Extender: Eduard Roux in Treatment: 0 Active Problems ICD-10 Encounter Code Description Active Date MDM Diagnosis T24.231A Burn of second degree of right lower leg, initial encounter 03/13/2022 No Yes L40.9 Psoriasis, unspecified 03/13/2022 No Yes J45.40 Moderate persistent asthma, uncomplicated 03/13/2022 No Yes Ruth Charles, Ruth Charles ([id number]) [id number] Page 4 of 8 Inactive Problems Resolved Problems Electronic Signature(s) Signed: 03/13/2022 9:00:32 AM By:  [name] MD FACS Previous Signature: 03/13/2022 8:37:54 AM Version By: [name] MD FACS Previous Signature: 03/13/2022 8:37:50 AM Version By: [name] MD FACS Previous Signature: 03/13/2022 8:31:14 AM Version By: [name] MD FACS Entered By: [name] on 03/13/2022 09:00:32 -------------------------------------------------------------------------------- Progress Note Details Patient Name: Date of Service: Ruth Charles, Ruth L. 03/13/2022 8:00 A M Medical Record Number: [id number] Patient Account Number: [id number] Date of Birth/Sex: Treating RN: 1971/05/29 (51 y.o. F) Primary Care Provider: Maurice Small Other Clinician: Referring Provider: Treating Provider/Extender: Eduard Roux in Treatment: 0 Subjective Chief Complaint Information obtained from Patient Patient presents to the wound care center with burn wound(s) to the right lower leg History of Present Illness (HPI) ADMISSION 03/13/2022 This  is a 51 year old woman with a past medical history significant for psoriasis/psoriatic arthritis, anxiety, neuropathy, and asthma. She was boiling water for tea on February 24, 2022 when she spilled the hot water onto her right lower leg and part of her dorsal foot. She suffered a superficial partial-thickness burn. Apparently Silvadene was applied in her doctor's office, but she did not receive a prescription. She has been treating it on her own at home using Xeroform gauze with an extra layer of petrolatum over the top of it and wrapping with Kerlix. She has not had any fevers or chills. No purulent drainage from her burn. On the lateral aspect of her right lower leg starting at about the mid leg down onto the top of her foot, there is a very clean partial-thickness burn. There is old loose skin hanging from the edges, consistent with a blistering history. There is a little bit of slough and biofilm on the wound surface. No  concern for infection. Patient History Information obtained from Patient. Allergies Humera (Severity: Severe), Sulfa, remecaid Family History Unknown History. Social History Never smoker, Marital Status - Married, Alcohol Use - Moderate, Drug Use - No History, Caffeine Use - Daily. Medical History Hematologic/Lymphatic Patient has history of Anemia Respiratory Patient has history of Asthma Medical A Surgical History Notes nd Ear/Nose/Mouth/Throat Tinnitus Gastrointestinal GERD PUD Musculoskeletal Arthritis (psoriatic) Review of Systems (ROS) Constitutional Symptoms (General Health) Denies complaints or symptoms of Fatigue, Fever, Chills, Marked Weight Change. Eyes Denies complaints or symptoms of Dry Eyes, Vision Changes, Glasses / Contacts. Integumentary (Skin) Complains or has symptoms of Wounds - Burn,Partial thickness, Right Ruth Charles, Ruth Charles ([id number]) [id number] Page 5 of 8 Objective Constitutional No acute distress. Vitals Time Taken: 8:00 AM, Height: 62 in, Weight: 135 lbs, BMI: 24.7, Temperature: 98.8 F, Pulse: 76 bpm, Respiratory Rate: 18 breaths/min, Blood Pressure: 124/78 mmHg. Respiratory Normal work of breathing on room air.. General Notes: On the lateral aspect of her right lower leg starting at about the mid leg down onto the top of her foot, there is a very clean partial-thickness burn. There is old loose skin hanging from the edges, consistent with a blistering history. There is a little bit of slough and biofilm on the wound surface. No concern for infection. Integumentary (Hair, Skin) Wound #1 status is Open. Original cause of wound was Thermal Burn. The date acquired was: 02/24/2022. The wound is located on the Right,Lateral Lower Leg. The wound measures 15.5cm length x 10cm width x 0.1cm depth; 121.737cm^2 area and 12.174cm^3 volume. There is Fat Layer (Subcutaneous Tissue) exposed. There is no tunneling or  undermining noted. There is a medium amount of serous drainage noted. There is large (67-100%) red granulation within the wound bed. There is a small (1-33%) amount of necrotic tissue within the wound bed including Adherent Slough. The periwound skin appearance exhibited: Scarring. Periwound temperature was noted as No Abnormality. Assessment Active Problems ICD-10 Burn of second degree of right lower leg, initial encounter Psoriasis, unspecified Moderate persistent asthma, uncomplicated Procedures Wound #1 Pre-procedure diagnosis of Wound #1 is a 2nd degree Burn located on the Right,Lateral Lower Leg . An Dressings and/or debridement of burns; small procedure was performed by Fredirick Maudlin, MD. Post procedure Diagnosis Wound #1: Same as Pre-Procedure Notes: Used scissors,forceps and curette. Removed slough and biofilm Scribed for Dr. Celine Ahr by J.Scotton Plan Follow-up Appointments: Return Appointment in 2 weeks. - Dr. Celine Ahr Room 3 Anesthetic: (In clinic) Topical Lidocaine 4% applied to wound bed - used in  clinic Bathing/ Shower/ Hygiene: Other Bathing/Shower/Hygiene Orders/Instructions: - Change wound dressing after a shower Edema Control - Lymphedema / SCD / Other: Avoid standing for long periods of time. - throughout the day WOUND #1: - Lower Leg Wound Laterality: Right, Lateral Cleanser: Soap and Water 1 x Per Day/30 Days Discharge Instructions: May shower and wash wound with dial antibacterial soap and water prior to dressing change. Prim Dressing: Xeroform Occlusive Gauze Dressing, 4x4 in 1 x Per Day/30 Days ary Discharge Instructions: Apply to wound bed as instructed Secondary Dressing: T Non-Adherent Dressing, 3x4 in 1 x Per Day/30 Days elfa Discharge Instructions: Apply over primary dressing as directed. Secured With: [employer], 4.5x3.1 (in/yd) 1 x Per Day/30 Days Discharge Instructions: Secure with Kerlix as directed. Secured With: Paper T ape, 2x10 (in/yd)  1 x Per Day/30 Days Discharge Instructions: Secure dressing with tape as directed. 03/13/2022: This is a 51 year old woman who spilled boiling water on her leg and foot. On the lateral aspect of her right lower leg starting at about the mid leg down onto the top of her foot, there is a very clean partial-thickness burn. There is old loose skin hanging from the edges, consistent with a blistering history. Ruth Charles, Ruth Charles ([id number]) [id number] Page 6 of 8 There is a little bit of slough and biofilm on the wound surface. No concern for infection. I used scissors and forceps to trim away all of the loose hanging dead skin from her old blisters. I then used a curette to debride a light layer of slough and biofilm from her wound. As she does have a sulfa allergy, we will err on the side of caution and not apply Silvadene. The wound has done well with Xeroform dressings so we will continue these. She will follow-up with me in 2 weeks. Electronic Signature(s) Signed: 03/13/2022 9:14:02 AM By: [name] MD FACS Previous Signature: 03/13/2022 9:04:16 AM Version By: [name] MD FACS Entered By: [name] on 03/13/2022 09:14:01 -------------------------------------------------------------------------------- HxROS Details Patient Name: Date of Service: Ruth Charles, Ruth L. 03/13/2022 8:00 A M Medical Record Number: [id number] Patient Account Number: [id number] Date of Birth/Sex: Treating RN: 1970-10-10 (51 y.o. Katrinka Blazing Primary Care Provider: Maurice Small Other Clinician: Referring Provider: Treating Provider/Extender: Eduard Roux in Treatment: 0 Information Obtained From Patient Constitutional Symptoms (General Health) Complaints and Symptoms: Negative for: Fatigue; Fever; Chills; Marked Weight Change Eyes Complaints and Symptoms: Negative for: Dry Eyes; Vision Changes; Glasses / Contacts Integumentary  (Skin) Complaints and Symptoms: Positive for: Wounds - Burn,Partial thickness, Right LL Ear/Nose/Mouth/Throat Medical History: Past Medical History Notes: Tinnitus Hematologic/Lymphatic Medical History: Positive for: Anemia Respiratory Medical History: Positive for: Asthma Gastrointestinal Medical History: Past Medical History Notes: GERD PUD Immunological Ruth Charles, HAAGEN ([id number]) [id number] Page 7 of 8 Musculoskeletal Medical History: Past Medical History Notes: Arthritis (psoriatic) Oncologic Immunizations Pneumococcal Vaccine: Received Pneumococcal Vaccination: Yes Received Pneumococcal Vaccination On or After 60th Birthday: No Implantable Devices None Family and Social History Unknown History: Yes; Never smoker; Marital Status - Married; Alcohol Use: Moderate; Drug Use: No History; Caffeine Use: Daily; Financial Concerns: No; Food, Clothing or Shelter Needs: No; Support System Lacking: No; Transportation Concerns: No Electronic Signature(s) Signed: 03/13/2022 10:08:47 AM By: [name] MD FACS Signed: 03/13/2022 10:20:57 AM By: Karie Schwalbe RN Entered By: Karie Schwalbe on 03/13/2022 08:22:19 -------------------------------------------------------------------------------- SuperBill Details Patient Name: Date of Service: Diego Cory 03/13/2022 Medical Record Number: [id number] Patient Account Number: [id number] Date of Birth/Sex: Treating  RN: Jun 28, 1970 (51 y.o. F) Primary Care Provider: Maurice Small Other Clinician: Referring Provider: Treating Provider/Extender: Eduard Roux in Treatment: 0 Diagnosis Coding ICD-10 Codes Code Description T24.231A Burn of second degree of right lower leg, initial encounter L40.9 Psoriasis, unspecified J45.40 Moderate persistent asthma, uncomplicated Facility Procedures : CPT4 Code: [id number] Description: 99214 - WOUND CARE VISIT-LEV 4 EST  PT Modifier: Quantity: 1 : CPT4 Code: 32355732 Description: 16020 - BURN DRSG W/O ANESTH-SM ICD-10 Diagnosis Description T24.231A Burn of second degree of right lower leg, initial encounter Modifier: Quantity: 1 Physician Procedures : CPT4 Code Description Modifier [id number] 99204 - WC PHYS LEVEL 4 - NEW PT 25 ICD-10 Diagnosis Description T24.231A Burn of second degree of right lower leg, initial encounter L40.9 Psoriasis, unspecified J45.40 Moderate persistent asthma, uncomplicated Quantity: 1 : [id number] 16020 - WC PHYS DRESS/DEBRID SM,<5% TOT BODY SURF ICD-10 Diagnosis Description T24.231A Burn of second degree of right lower leg, initial encounter MARGRIT, MINNER ([id number]) [id number] Pa Quantity: 1 ge 8 of 8 Electronic Signature(s) Signed: 03/13/2022 10:20:57 AM By: Karie Schwalbe RN Signed: 03/13/2022 11:49:46 AM By: [name] MD FACS Previous Signature: 03/13/2022 9:14:54 AM Version By: [name] MD FACS Entered By: Karie Schwalbe on 03/13/2022 10:14:48

## 2022-03-13 NOTE — Progress Notes (Signed)
Ruth Charles, Ruth Charles ([id number]) [phone].pdf Page 1 of 4 Visit Report for 03/13/2022 Abuse Risk Screen Details Patient Name: Date of Service: Ruth Charles, Ruth Charles 03/13/2022 8:00 A M Medical Record Number: [id number] Patient Account Number: [id number] Date of Birth/Sex: Treating RN: 01-10-1971 (51 y.o. America Brown Primary Care Robt Okuda: Kelton Pillar Other Clinician: Referring Findley Vi: Treating Dontravious Camille/Extender: Tonna Corner in Treatment: 0 Abuse Risk Screen Items Answer ABUSE RISK SCREEN: Has anyone close to you tried to hurt or harm you recentlyo No Do you feel uncomfortable with anyone in your familyo No Has anyone forced you do things that you didnt want to doo No Electronic Signature(s) Signed: 03/13/2022 10:20:57 AM By: Dellie Catholic RN Entered By: Dellie Catholic on 03/13/2022 08:15:07 -------------------------------------------------------------------------------- Activities of Daily Living Details Patient Name: Date of Service: Ruth Charles, Ruth Charles 03/13/2022 8:00 A M Medical Record Number: [id number] Patient Account Number: [id number] Date of Birth/Sex: Treating RN: August 14, 1970 (51 y.o. America Brown Primary Care Shamara Soza: Kelton Pillar Other Clinician: Referring Esta Carmon: Treating Kert Shackett/Extender: Tonna Corner in Treatment: 0 Activities of Daily Living Items Answer Activities of Daily Living (Please select one for each item) Drive Automobile Completely Able T Medications ake Completely Able Use T elephone Completely Able Care for Appearance Completely Able Use T oilet Completely Able Bath / Shower Completely Able Dress Self Completely Able Feed Self Completely Able Walk Completely Able Get In / Out Bed Completely Able Housework Completely Able Prepare Meals Completely Able Handle Money Completely Able Shop for Self Completely Able Electronic  Signature(s) Signed: 03/13/2022 10:20:57 AM By: Dellie Catholic RN Entered By: Dellie Catholic on 03/13/2022 08:15:38 Jaynie Collins ([id number]) [id number] Page 2 of 4 -------------------------------------------------------------------------------- Education Screening Details Patient Name: Date of Service: Ruth Charles, Ruth Charles 03/13/2022 8:00 A M Medical Record Number: [id number] Patient Account Number: [id number] Date of Birth/Sex: Treating RN: 21-Nov-1970 (51 y.o. America Brown Primary Care Shanai Lartigue: Kelton Pillar Other Clinician: Referring Kiran Lapine: Treating Theo Krumholz/Extender: Tonna Corner in Treatment: 0 Primary Learner Assessed: Patient Learning Preferences/Education Level/Primary Language Learning Preference: Explanation, Demonstration, Printed Material Highest Education Level: College or Above Preferred Language: English Cognitive Barrier Language Barrier: No Translator Needed: No Memory Deficit: No Emotional Barrier: No Cultural/Religious Beliefs Affecting Medical Care: No Physical Barrier Impaired Vision: No Impaired Hearing: No Decreased Hand dexterity: No Knowledge/Comprehension Knowledge Level: High Comprehension Level: High Ability to understand written instructions: High Ability to understand verbal instructions: High Motivation Anxiety Level: Calm Cooperation: Cooperative Education Importance: Acknowledges Need Interest in Health Problems: Asks Questions Perception: Coherent Willingness to Engage in Self-Management High Activities: Readiness to Engage in Self-Management High Activities: Electronic Signature(s) Signed: 03/13/2022 10:20:57 AM By: Dellie Catholic RN Entered By: Dellie Catholic on 03/13/2022 08:16:35 -------------------------------------------------------------------------------- Fall Risk Assessment Details Patient Name: Date of Service: Ruth Charles, Ruth L. 03/13/2022  8:00 A M Medical Record Number: [id number] Patient Account Number: [id number] Date of Birth/Sex: Treating RN: [birth year] (51 y.o. America Brown Primary Care Amoni Morales: Kelton Pillar Other Clinician: Referring Christa Fasig: Treating Pamalee Marcoe/Extender: Tonna Corner in Treatment: 0 Fall Risk Assessment Items Have you had 2 or more falls in the last 12 monthso 0 No Ruth Charles, Ruth Charles ([id number]) [phone] [id number] Page 3 of 4 Have you had any fall that resulted in injury in the last 12 monthso 0 No FALLS RISK SCREEN History of falling - immediate or within 3 months 0 No Secondary diagnosis (Do you have 2 or more medical  diagnoseso) 0 No Ambulatory aid None/bed rest/wheelchair/nurse 0 No Crutches/cane/walker 0 No Furniture 0 No Intravenous therapy Access/Saline/Heparin Lock 0 No Gait/Transferring Normal/ bed rest/ wheelchair 0 No Weak (short steps with or without shuffle, stooped but able to lift head while walking, may seek 0 No support from furniture) Impaired (short steps with shuffle, may have difficulty arising from chair, head down, impaired 0 No balance) Mental Status Oriented to own ability 0 No Electronic Signature(s) Signed: 03/13/2022 10:20:57 AM By: Karie Schwalbe RN Entered By: Karie Schwalbe on 03/13/2022 08:16:56 -------------------------------------------------------------------------------- Foot Assessment Details Patient Name: Date of Service: Ruth Lolling L. 03/13/2022 8:00 A M Medical Record Number: [id number] Patient Account Number: [id number] Date of Birth/Sex: Treating RN: [birth year] (51 y.o. Katrinka Blazing Primary Care Juwana Thoreson: Maurice Small Other Clinician: Referring Evelia Waskey: Treating Kairen Hallinan/Extender: Eduard Roux in Treatment: 0 Foot Assessment Items Site Locations + = Sensation present, - = Sensation absent, C = Callus, U = Ulcer R = Redness, W = Warmth, M =  Maceration, PU = Pre-ulcerative lesion F = Fissure, S = Swelling, D = Dryness Assessment Right: Left: Other Deformity: No No Prior Foot Ulcer: No No Prior Amputation: No No Charcot Joint: No No Ambulatory Status: Ambulatory Without Help GaitFEIGA, Ruth Charles ([id number]) [phone].pdf Page 4 of 4 Electronic Signature(s) Signed: 03/13/2022 10:20:57 AM By: Karie Schwalbe RN Entered By: Karie Schwalbe on 03/13/2022 08:23:47 -------------------------------------------------------------------------------- Nutrition Risk Screening Details Patient Name: Date of Service: Ruth Charles, Ruth Charles 03/13/2022 8:00 A M Medical Record Number: [id number] Patient Account Number: [id number] Date of Birth/Sex: Treating RN: 05/10/1971 (51 y.o. Katrinka Blazing Primary Care Sakiyah Shur: Maurice Small Other Clinician: Referring Loletta Harper: Treating Myrtha Tonkovich/Extender: Eduard Roux in Treatment: 0 Height (in): 62 Weight (lbs): 135 Body Mass Index (BMI): 24.7 Nutrition Risk Screening Items Score Screening NUTRITION RISK SCREEN: I have an illness or condition that made me change the kind and/or amount of food I eat 0 No I eat fewer than two meals per day 0 No I eat few fruits and vegetables, or milk products 0 No I have three or more drinks of beer, liquor or wine almost every day 0 No I have tooth or mouth problems that make it hard for me to eat 0 No I don't always have enough money to buy the food I need 0 No I eat alone most of the time 0 No I take three or more different prescribed or over-the-counter drugs a day 0 No Without wanting to, I have lost or gained 10 pounds in the last six months 0 No I am not always physically able to shop, cook and/or feed myself 0 No Nutrition Protocols Good Risk Protocol 0 No interventions needed Moderate Risk Protocol High Risk Proctocol Risk Level: Good Risk Score: 0 Electronic  Signature(s) Signed: 03/13/2022 10:20:57 AM By: Karie Schwalbe RN Entered By: Karie Schwalbe on 03/13/2022 08:17:11

## 2022-03-13 NOTE — Progress Notes (Signed)
KATHEE, TUMLIN ([id number]) [id number] Page 1 of 8 Visit Report for 03/13/2022 Allergy List Details Patient Name: Date of Service: Ruth Charles, Ruth Charles 03/13/2022 8:00 A M Medical Record Number: [id number] Patient Account Number: [id number] Date of Birth/Sex: Treating RN: [birth year] (51 y.o. Katrinka Blazing Primary Care Blane Worthington: Maurice Small Other Clinician: Referring Aftan Vint: Treating Blakley Michna/Extender: Eduard Roux in Treatment: 0 Allergies Active Allergies Humera Severity: Severe Type: Medication Sulfa Type: Medication remecaid Type: Medication Allergy Notes Electronic Signature(s) Signed: 03/13/2022 10:20:57 AM By: Karie Schwalbe RN Entered By: Karie Schwalbe on 03/13/2022 08:10:31 -------------------------------------------------------------------------------- Arrival Information Details Patient Name: Date of Service: Ruth Lolling L. 03/13/2022 8:00 A M Medical Record Number: [id number] Patient Account Number: [id number] Date of Birth/Sex: Treating RN: 1970/10/03 (51 y.o. Katrinka Blazing Primary Care Niyanna Asch: Maurice Small Other Clinician: Referring Thomasenia Dowse: Treating Marissa Lowrey/Extender: Eduard Roux in Treatment: 0 Visit Information Patient Arrived: Ambulatory Arrival Time: 08:00 Accompanied By: Hillard Danker Transfer Assistance: None Patient Identification Verified: Yes Electronic Signature(s) Signed: 03/13/2022 10:20:57 AM By: Karie Schwalbe RN Entered By: Karie Schwalbe on 03/13/2022 08:06:11 Lucky Rathke ([id number]) [id number] Page 2 of 8 -------------------------------------------------------------------------------- Clinic Level of Care Assessment Details Patient Name: Date of Service: Ruth Charles, Ruth Charles 03/13/2022 8:00 A M Medical Record Number: [id number] Patient Account Number: [id number] Date of Birth/Sex: Treating  RN: [birth year] (50 y.o. Katrinka Blazing Primary Care Javanna Patin: Maurice Small Other Clinician: Referring Laketra Bowdish: Treating Samari Gorby/Extender: Eduard Roux in Treatment: 0 Clinic Level of Care Assessment Items TOOL 1 Quantity Score X- 1 0 Use when EandM and Procedure is performed on INITIAL visit ASSESSMENTS - Nursing Assessment / Reassessment X- 1 20 General Physical Exam (combine w/ comprehensive assessment (listed just below) when performed on new pt. evals) X- 1 25 Comprehensive Assessment (HX, ROS, Risk Assessments, Wounds Hx, etc.) ASSESSMENTS - Wound and Skin Assessment / Reassessment X- 1 10 Dermatologic / Skin Assessment (not related to wound area) ASSESSMENTS - Ostomy and/or Continence Assessment and Care []  - 0 Incontinence Assessment and Management []  - 0 Ostomy Care Assessment and Management (repouching, etc.) PROCESS - Coordination of Care X - Simple Patient / Family Education for ongoing care 1 15 []  - 0 Complex (extensive) Patient / Family Education for ongoing care X- 1 10 Staff obtains , Records, T Results / Process Orders est X- 1 10 Staff telephones HHA, Nursing Homes / Clarify orders / etc []  - 0 Routine Transfer to another Facility (non-emergent condition) []  - 0 Routine Hospital Admission (non-emergent condition) X- 1 15 New Admissions / / Ordering NPWT Apligraf, etc. , []  - 0 Emergency Hospital Admission (emergent condition) PROCESS - Special Needs []  - 0 Pediatric / Minor Patient Management []  - 0 Isolation Patient Management []  - 0 Hearing / Language / Visual special needs []  - 0 Assessment of Community assistance (transportation, D/C planning, etc.) []  - 0 Additional assistance / Altered mentation []  - 0 Support Surface(s) Assessment (bed, cushion, seat, etc.) INTERVENTIONS - Miscellaneous []  - 0 External ear exam []  - 0 Patient Transfer (multiple staff / /  Similar devices) []  - 0 Simple Staple / Suture removal (25 or less) []  - 0 Complex Staple / Suture removal (26 or more) []  - 0 Hypo/Hyperglycemic Management (do not check if billed separately) X- 1 15 Ankle / Brachial Index (ABI) - do not check if billed separately Has the patient been seen at the hospital within the  last three years: Yes Total Score: 120 Level Of Care: New/Established - Level 4 Electronic Signature(s) Signed: 03/13/2022 10:20:57 AM By: Karie Schwalbe RN [place],Signed: 03/13/2022 10:20:57 AM By: Karie Schwalbe RN Remonia Richter ([id number]) [phone].pdf Page 3 of 8 Entered By: Karie Schwalbe on 03/13/2022 10:14:34 -------------------------------------------------------------------------------- Encounter Discharge Information Details Patient Name: Date of Service: Ruth Charles, Ruth Charles 03/13/2022 8:00 A M Medical Record Number: [id number] Patient Account Number: [id number] Date of Birth/Sex: Treating RN: 03/27/1971 (51 y.o. Katrinka Blazing Primary Care Dalbert Stillings: Maurice Small Other Clinician: Referring Henley Boettner: Treating Jazzmyn Filion/Extender: Eduard Roux in Treatment: 0 Encounter Discharge Information Items Discharge Condition: Stable Ambulatory Status: Ambulatory Discharge Destination: Home Transportation: Private Auto Accompanied By: sig other Schedule Follow-up Appointment: Yes Clinical Summary of Care: Patient Declined Electronic Signature(s) Signed: 03/13/2022 10:20:57 AM By: Karie Schwalbe RN Entered By: Karie Schwalbe on 03/13/2022 10:15:48 -------------------------------------------------------------------------------- Lower Extremity Assessment Details Patient Name: Date of Service: Ruth Charles, Ruth Charles 03/13/2022 8:00 A M Medical Record Number: [id number] Patient Account Number: [id number] Date of Birth/Sex: Treating RN: 11/09/1970 (51 y.o. Katrinka Blazing Primary Care Juwana Thoreson: Maurice Small  Other Clinician: Referring Zakariya Knickerbocker: Treating Rosabelle Jupin/Extender: Eduard Roux in Treatment: 0 Edema Assessment Assessed: [Left: No] [Right: No] [Left: Edema] [Right: :] Calf Left: Right: Point of Measurement: 28 cm From Medial Instep 32.5 cm Ankle Left: Right: Point of Measurement: 9 cm From Medial Instep 23.5 cm Knee To Floor Left: Right: From Medial Instep 40 cm Vascular Assessment Pulses: Dorsalis Pedis Palpable: [Right:Yes] Blood Pressure: Ruth Charles, Ruth Charles ([id number]) [Right:[id number] Page 4 of 8] Brachial: [Right:124] Ankle: [Right:Dorsalis Pedis: 132 1.06] Electronic Signature(s) Signed: 03/13/2022 10:20:57 AM By: Karie Schwalbe RN Entered By: Karie Schwalbe on 03/13/2022 08:34:09 -------------------------------------------------------------------------------- Multi Wound Chart Details Patient Name: Date of Service: Ruth Lolling L. 03/13/2022 8:00 A M Medical Record Number: [id number] Patient Account Number: [id number] Date of Birth/Sex: Treating RN: 1971/01/10 (51 y.o. F) Primary Care Terah Robey: Maurice Small Other Clinician: Referring Jarrius Huaracha: Treating Shanitra Phillippi/Extender: Eduard Roux in Treatment: 0 Vital Signs Height(in): 62 Pulse(bpm): 76 Weight(lbs): 135 Blood Pressure(mmHg): 124/78 Body Mass Index(BMI): 24.7 Temperature(F): 98.8 Respiratory Rate(breaths/min): 18 [1:Photos:] [N/A:N/A] Right, Lateral Lower Leg N/A N/A Wound Location: Thermal Burn N/A N/A Wounding Event: 2nd degree Burn N/A N/A Primary Etiology: Anemia, Asthma N/A N/A Comorbid History: 02/24/2022 N/A N/A Date Acquired: 0 N/A N/A Weeks of Treatment: Open N/A N/A Wound Status: No N/A N/A Wound Recurrence: 15.5x10x0.1 N/A N/A Measurements L x W x D (cm) 121.737 N/A N/A A (cm) : rea 12.174 N/A N/A Volume (cm) : Partial Thickness N/A N/A Classification: Medium N/A N/A Exudate A  mount: Serous N/A N/A Exudate Type: amber N/A N/A Exudate Color: Large (67-100%) N/A N/A Granulation A mount: Red N/A N/A Granulation Quality: Small (1-33%) N/A N/A Necrotic A mount: Fat Layer (Subcutaneous Tissue): Yes N/A N/A Exposed Structures: Fascia: No Tendon: No Muscle: No Joint: No Bone: No Small (1-33%) N/A N/A Epithelialization: Scarring: Yes N/A N/A Periwound Skin Texture: No Abnormality N/A N/A Temperature: Dressings and/or debridement of N/A N/A Procedures Performed: burns; small Treatment Notes Ruth Charles, Ruth Charles ([id number]) [id number] Page 5 of 8 Electronic Signature(s) Signed: 03/13/2022 9:00:37 AM By: [name] MD FACS Previous Signature: 03/13/2022 8:38:03 AM Version By: [name] MD FACS Entered By: [name] on 03/13/2022 09:00:37 -------------------------------------------------------------------------------- Multi-Disciplinary Care Plan Details Patient Name: Date of Service: Ruth Cory. 03/13/2022 8:00 A M Medical Record Number: [id number] Patient Account Number: [id number]  Date of Birth/Sex: Treating RN: December 25, 1970 (51 y.o. America Brown Primary Care Mat Stuard: Kelton Pillar Other Clinician: Referring Inioluwa Baris: Treating Zakariyya Helfman/Extender: Tonna Corner in Treatment: 0 Active Inactive Wound/Skin Impairment Nursing Diagnoses: Impaired tissue integrity Goals: Patient/caregiver will verbalize understanding of skin care regimen Date Initiated: 03/13/2022 Target Resolution Date: 06/01/2022 Goal Status: Active Interventions: Assess ulceration(s) every visit Treatment Activities: Skin care regimen initiated : 03/13/2022 Notes: Electronic Signature(s) Signed: 03/13/2022 10:20:57 AM By: Dellie Catholic RN Entered By: Dellie Catholic on 03/13/2022 10:13:24 -------------------------------------------------------------------------------- Pain Assessment  Details Patient Name: Date of Service: Ruth Peer L. 03/13/2022 8:00 A M Medical Record Number: [id number] Patient Account Number: [id number] Date of Birth/Sex: Treating RN: [birth year] (51 y.o. America Brown Primary Care Honestie Kulik: Kelton Pillar Other Clinician: Referring Wille Aubuchon: Treating Ritaj Dullea/Extender: Tonna Corner in Treatment: 0 Active Problems Location of Pain Severity and Description of Pain Patient Has Paino No Site Locations CASIDEE, JANN ([id number]) [phone].pdf Page 6 of 8 Pain Management and Medication Current Pain Management: Electronic Signature(s) Signed: 03/13/2022 10:20:57 AM By: Dellie Catholic RN Entered By: Dellie Catholic on 03/13/2022 08:29:19 -------------------------------------------------------------------------------- Patient/Caregiver Education Details Patient Name: Date of Service: Ruth Charles 10/12/2023andnbsp8:00 [place] Record Number: [id number] Patient Account Number: [id number] Date of Birth/Gender: Treating RN: 03/08/1971 (51 y.o. America Brown Primary Care Physician: Kelton Pillar Other Clinician: Referring Physician: Treating Physician/Extender: Tonna Corner in Treatment: 0 Education Assessment Education Provided To: Patient Education Topics Provided Wound/Skin Impairment: Methods: Explain/Verbal Responses: Return demonstration correctly Electronic Signature(s) Signed: 03/13/2022 10:20:57 AM By: Dellie Catholic RN Entered By: Dellie Catholic on 03/13/2022 10:13:33 -------------------------------------------------------------------------------- Wound Assessment Details Patient Name: Date of Service: Ruth Peer L. 03/13/2022 8:00 A M Medical Record Number: [id number] Patient Account Number: [id number] Date of Birth/Sex: Treating RN: Apr 24, 1971 (51 y.o. America Brown Primary Care Campbell Kray: Kelton Pillar Other  Clinician: Referring Myrel Rappleye: Treating Paul Torpey/Extender: Marche, Hottenstein ([id number]) [phone].pdf Page 7 of 8 Weeks in Treatment: 0 Wound Status Wound Number: 1 Primary Etiology: 2nd degree Burn Wound Location: Right, Lateral Lower Leg Wound Status: Open Wounding Event: Thermal Burn Comorbid History: Anemia, Asthma Date Acquired: 02/24/2022 Weeks Of Treatment: 0 Clustered Wound: No Photos Wound Measurements Length: (cm) 15.5 Width: (cm) 10 Depth: (cm) 0.1 Area: (cm) 121.737 Volume: (cm) 12.174 % Reduction in Area: % Reduction in Volume: Epithelialization: Small (1-33%) Tunneling: No Undermining: No Wound Description Classification: Partial Thickness Exudate Amount: Medium Exudate Type: Serous Exudate Color: amber Foul Odor After Cleansing: No Slough/Fibrino Yes Wound Bed Granulation Amount: Large (67-100%) Exposed Structure Granulation Quality: Red Fascia Exposed: No Necrotic Amount: Small (1-33%) Fat Layer (Subcutaneous Tissue) Exposed: Yes Necrotic Quality: Adherent Slough Tendon Exposed: No Muscle Exposed: No Joint Exposed: No Bone Exposed: No Periwound Skin Texture Texture Color No Abnormalities Noted: No No Abnormalities Noted: No Scarring: Yes Temperature / Pain Temperature: No Abnormality Moisture No Abnormalities Noted: No Treatment Notes Wound #1 (Lower Leg) Wound Laterality: Right, Lateral Cleanser Soap and Water Discharge Instruction: May shower and wash wound with dial antibacterial soap and water prior to dressing change. Peri-Wound Care Topical Primary Dressing Xeroform Occlusive Gauze Dressing, 4x4 in Discharge Instruction: Apply to wound bed as instructed Secondary Dressing T Non-Adherent Dressing, 3x4 in elfa Discharge Instruction: Apply over primary dressing as directed. Ruth Charles, Ruth Charles ([id number]) [id number] Page 8 of 8 Secured  With [employer], 4.5x3.1 (in/yd) Discharge Instruction: Secure with Kerlix as directed. Paper Tape, 2x10 (  in/yd) Discharge Instruction: Secure dressing with tape as directed. Compression Wrap Coban Self-Adherent Wrap 4x5 (in/yd) Discharge Instruction: Apply over Kerlix as directed. Compression Stockings Add-Ons Electronic Signature(s) Signed: 03/13/2022 10:20:57 AM By: Karie Schwalbe RN Entered By: Karie Schwalbe on 03/13/2022 08:37:13 -------------------------------------------------------------------------------- Vitals Details Patient Name: Date of Service: Ruth Charles, Ruth L. 03/13/2022 8:00 A M Medical Record Number: [id number] Patient Account Number: [id number] Date of Birth/Sex: Treating RN: 1970-12-10 (51 y.o. Katrinka Blazing Primary Care Shakirah Kirkey: Maurice Small Other Clinician: Referring Harshith Pursell: Treating Marg Macmaster/Extender: Eduard Roux in Treatment: 0 Vital Signs Time Taken: 08:00 Temperature (F): 98.8 Height (in): 62 Pulse (bpm): 76 Weight (lbs): 135 Respiratory Rate (breaths/min): 18 Body Mass Index (BMI): 24.7 Blood Pressure (mmHg): 124/78 Reference Range: 80 - 120 mg / dl Electronic Signature(s) Signed: 03/13/2022 10:20:57 AM By: Karie Schwalbe RN Entered By: Karie Schwalbe on 03/13/2022 08:07:26

## 2022-03-27 ENCOUNTER — Encounter (HOSPITAL_BASED_OUTPATIENT_CLINIC_OR_DEPARTMENT_OTHER): Payer: Managed Care, Other (non HMO) | Admitting: General Surgery

## 2022-03-27 DIAGNOSIS — T24231A Burn of second degree of right lower leg, initial encounter: Secondary | ICD-10-CM | POA: Diagnosis not present

## 2022-03-27 NOTE — Progress Notes (Signed)
ROGAN, ECKLUND ([id number]) [id number] Page 1 of 7 Visit Report for 03/27/2022 Arrival Information Details Patient Name: Date of Service: Ruth Charles, Ruth Charles 03/27/2022 11:00 A M Medical Record Number: [id number] Patient Account Number: [id number] Date of Birth/Sex: Treating RN: 09-04-1970 (51 y.o. F) Primary Care Reuven Braver: Maurice Small Other Clinician: Referring Derian Dimalanta: Treating Seamus Warehime/Extender: Eduard Roux in Treatment: 2 Visit Information History Since Last Visit All ordered tests and consults were completed: No Patient Arrived: Ambulatory Added or deleted any medications: No Arrival Time: 10:58 Any new allergies or adverse reactions: No Transfer Assistance: None Had a fall or experienced change in No Patient Identification Verified: Yes activities of daily living that may affect Secondary Verification Process Completed: Yes risk of falls: Patient Requires Transmission-Based Precautions: No Signs or symptoms of abuse/neglect since last visito No Patient Has Alerts: No Hospitalized since last visit: No Implantable device outside of the clinic excluding No cellular tissue based products placed in the center since last visit: Pain Present Now: No Electronic Signature(s) Signed: 03/27/2022 11:57:43 AM By: Dayton Scrape Entered By: Dayton Scrape on 03/27/2022 10:58:57 -------------------------------------------------------------------------------- Clinic Level of Care Assessment Details Patient Name: Date of Service: JASMYNN, PFALZGRAF 03/27/2022 11:00 A M Medical Record Number: [id number] Patient Account Number: [id number] Date of Birth/Sex: Treating RN: [birth year] (51 y.o. Katrinka Blazing Primary Care Payal Stanforth: Maurice Small Other Clinician: Referring Gwenna Fuston: Treating Konstantin Lehnen/Extender: Eduard Roux in Treatment: 2 Clinic Level of Care Assessment Items TOOL 4 Quantity Score X- 1  0 Use when only an EandM is performed on FOLLOW-UP visit ASSESSMENTS - Nursing Assessment / Reassessment X- 1 10 Reassessment of Co-morbidities (includes updates in patient status) X- 1 5 Reassessment of Adherence to Treatment Plan ASSESSMENTS - Wound and Skin A ssessment / Reassessment []  - 0 Simple Wound Assessment / Reassessment - one wound []  - 0 Complex Wound Assessment / Reassessment - multiple wounds X- 1 10 Dermatologic / Skin Assessment (not related to wound area) ASSESSMENTS - Focused Assessment []  - 0 Circumferential Edema Measurements - multi extremities []  - 0 Nutritional Assessment / Counseling / Intervention MARELYN, ROUSER ( ) [id number] Page 2 of 7 []  - 0 Lower Extremity Assessment (monofilament, tuning fork, pulses) []  - 0 Peripheral Arterial Disease Assessment (using hand held doppler) ASSESSMENTS - Ostomy and/or Continence Assessment and Care []  - 0 Incontinence Assessment and Management []  - 0 Ostomy Care Assessment and Management (repouching, etc.) PROCESS - Coordination of Care []  - 0 Simple Patient / Family Education for ongoing care []  - 0 Complex (extensive) Patient / Family Education for ongoing care X- 1 10 Staff obtains , Records, T Results / Process Orders est X- 1 10 Staff telephones HHA, Nursing Homes / Clarify orders / etc []  - 0 Routine Transfer to another Facility (non-emergent condition) []  - 0 Routine Hospital Admission (non-emergent condition) []  - 0 New Admissions / Lucky Rathke / Ordering NPWT Apligraf, etc. , []  - 0 Emergency Hospital Admission (emergent condition) X- 1 10 Simple Discharge Coordination []  - 0 Complex (extensive) Discharge Coordination PROCESS - Special Needs []  - 0 Pediatric / Minor Patient Management []  - 0 Isolation Patient Management []  - 0 Hearing / Language / Visual special needs []  - 0 Assessment of Community assistance (transportation,  D/C planning, etc.) []  - 0 Additional assistance / Altered mentation []  - 0 Support Surface(s) Assessment (bed, cushion, seat, etc.) INTERVENTIONS - Wound Cleansing / Measurement X - Simple Wound Cleansing - one wound 1  5 []  - 0 Complex Wound Cleansing - multiple wounds X- 1 5 Wound Imaging (photographs - any number of wounds) []  - 0 Wound Tracing (instead of photographs) X- 1 5 Simple Wound Measurement - one wound []  - 0 Complex Wound Measurement - multiple wounds INTERVENTIONS - Wound Dressings X - Small Wound Dressing one or multiple wounds 1 10 []  - 0 Medium Wound Dressing one or multiple wounds []  - 0 Large Wound Dressing one or multiple wounds []  - 0 Application of Medications - topical []  - 0 Application of Medications - injection INTERVENTIONS - Miscellaneous []  - 0 External ear exam []  - 0 Specimen Collection (cultures, biopsies, blood, body fluids, etc.) []  - 0 Specimen(s) / Culture(s) sent or taken to Lab for analysis []  - 0 Patient Transfer (multiple staff / / Similar devices) []  - 0 Simple Staple / Suture removal (25 or less) []  - 0 Complex Staple / Suture removal (26 or more) []  - 0 Hypo / Hyperglycemic Management (close monitor of Blood Glucose) TAMEE, BATTIN ( ) [id number] Page 3 of 7 []  - 0 Ankle / Brachial Index (ABI) - do not check if billed separately X- 1 5 Vital Signs Has the patient been seen at the hospital within the last three years: Yes Total Score: 85 Level Of Care: New/Established - Level 3 Electronic Signature(s) Signed: 03/27/2022 11:56:39 AM By: RN Entered By: on 03/27/2022 11:51:59 -------------------------------------------------------------------------------- Encounter Discharge Information Details Patient Name: Date of Service: . 03/27/2022 11:00 A M Medical Record Number: Patient Account Number: Nurse, adult Date of  Birth/Sex: Treating RN: 1970/11/26 (51 y.o. Primary Care Jammal Sarr: Lucky Rathke Other Clinician: Referring Arryanna Holquin: Treating Sparkle Aube/Extender: [id number] in Treatment: 2 Encounter Discharge Information Items Discharge Condition: Stable Ambulatory Status: Ambulatory Discharge Destination: Home Transportation: Private Auto Accompanied By: self Schedule Follow-up Appointment: Yes Clinical Summary of Care: Patient Declined Electronic Signature(s) Signed: 03/27/2022 11:56:39 AM By: RN Entered By: 03/29/2022 on 03/27/2022 11:52:49 -------------------------------------------------------------------------------- Lower Extremity Assessment Details Patient Name: Date of Service: SYENNA, NAZIR 03/27/2022 11:00 A M Medical Record Number: Diego Cory Patient Account Number: 03/29/2022 Date of Birth/Sex: Treating RN: [birth year] (51 y.o. 11/07/1970 Primary Care Lennix Kneisel: 40 Other Clinician: Referring Vashti Bolanos: Treating Jarrell Armond/Extender: Katrinka Blazing in Treatment: 2 Edema Assessment Assessed: [Left: No] [Right: No] [Left: Edema] [Right: :] Calf Left: Right: Point of Measurement: 28 cm From Medial Instep 32.5 cm Ankle Left: Right: Point of Measurement: 9 cm From Medial Instep 23.5 cm Vascular Assessment SHANNEN, [place] (Eduard Roux) [Right:[id number] Page 4 of 7] Pulses: Dorsalis Pedis Palpable: [Right:Yes] Electronic Signature(s) Signed: 03/27/2022 11:56:39 AM By: Karie Schwalbe RN Entered By: Karie Schwalbe on 03/27/2022 11:24:56 -------------------------------------------------------------------------------- Multi Wound Chart Details Patient Name: Date of Service: Diego Cory. 03/27/2022 11:00 A M Medical Record Number: [id number] Patient Account Number: [id number] Date of Birth/Sex: Treating RN: 07/17/1970 (51 y.o.  F) Primary Care Jenine Krisher: Katrinka Blazing Other Clinician: Referring Ryun Velez: Treating Marven Veley/Extender: Maurice Small in Treatment: 2 Vital Signs Height(in): 62 Pulse(bpm): 69 Weight(lbs): 135 Blood Pressure(mmHg): 111/74 Body Mass Index(BMI): 24.7 Temperature(F): 98.7 Respiratory Rate(breaths/min): 20 [1:Photos:] [N/A:N/A] Right, Lateral Lower Leg N/A N/A Wound Location: Thermal Burn N/A N/A Wounding Event: 2nd degree Burn N/A N/A Primary Etiology: Anemia, Asthma N/A N/A Comorbid History: 02/24/2022 N/A N/A Date Acquired: 2 N/A N/A Weeks of Treatment: Healed - Epithelialized N/A N/A  Wound Status: No N/A N/A Wound Recurrence: 0x0x0 N/A N/A Measurements L x W x D (cm) 0 N/A N/A A (cm) : rea 0 N/A N/A Volume (cm) : 100.00% N/A N/A % Reduction in A rea: 100.00% N/A N/A % Reduction in Volume: Partial Thickness N/A N/A Classification: None Present N/A N/A Exudate A mount: None Present (0%) N/A N/A Granulation A mount: None Present (0%) N/A N/A Necrotic A mount: Fascia: No N/A N/A Exposed Structures: Fat Layer (Subcutaneous Tissue): No Tendon: No Muscle: No Joint: No Bone: No Large (67-100%) N/A N/A Epithelialization: Rash: Yes N/A N/A Periwound Skin Texture: Scarring: Yes No Abnormality N/A N/A Temperature: Treatment Notes Electronic Signature(s) Signed: 03/27/2022 11:44:32 AM By: [name] MD FACS Lucky Rathke ([id number]) [id number] Page 5 of 7 Entered By: [name] on 03/27/2022 11:44:32 -------------------------------------------------------------------------------- Multi-Disciplinary Care Plan Details Patient Name: Date of Service: MAISEE, VOLLMAN 03/27/2022 11:00 A M Medical Record Number: [id number] Patient Account Number: [id number] Date of Birth/Sex: Treating RN: 05-05-1971 (51 y.o. Katrinka Blazing Primary Care Latisia Hilaire: Maurice Small Other  Clinician: Referring Dameion Briles: Treating Derwin Reddy/Extender: Eduard Roux in Treatment: 2 Active Inactive Electronic Signature(s) Signed: 03/27/2022 11:56:39 AM By: Karie Schwalbe RN Entered By: Karie Schwalbe on 03/27/2022 11:50:44 -------------------------------------------------------------------------------- Pain Assessment Details Patient Name: Date of Service: HAIVEN, NARDONE 03/27/2022 11:00 A M Medical Record Number: [id number] Patient Account Number: [id number] Date of Birth/Sex: Treating RN: 1970-12-30 (51 y.o. F) Primary Care Zoanne Newill: Maurice Small Other Clinician: Referring Amaad Byers: Treating Ada Holness/Extender: Eduard Roux in Treatment: 2 Active Problems Location of Pain Severity and Description of Pain Patient Has Paino No Site Locations Pain Management and Medication Current Pain Management: Electronic Signature(s) Signed: 03/27/2022 11:57:43 AM By: Dayton Scrape Entered By: Dayton Scrape on 03/27/2022 10:59:26 Lucky Rathke ([id number]) [id number] Page 6 of 7 -------------------------------------------------------------------------------- Patient/Caregiver Education Details Patient Name: Date of Service: MEIGAN, PATES 10/26/2023andnbsp11:00 A M Medical Record Number: [id number] Patient Account Number: [id number] Date of Birth/Gender: Treating RN: [birth year] (51 y.o. Katrinka Blazing Primary Care Physician: Maurice Small Other Clinician: Referring Physician: Treating Physician/Extender: Eduard Roux in Treatment: 2 Education Assessment Education Provided To: Patient Education Topics Provided Wound/Skin Impairment: Methods: Explain/Verbal Responses: Return demonstration correctly Electronic Signature(s) Signed: 03/27/2022 11:56:39 AM By: Karie Schwalbe RN Entered By: Karie Schwalbe on 03/27/2022  11:51:01 -------------------------------------------------------------------------------- Wound Assessment Details Patient Name: Date of Service: JENINA, MOENING 03/27/2022 11:00 A M Medical Record Number: [id number] Patient Account Number: [id number] Date of Birth/Sex: Treating RN: 1971-04-23 (51 y.o. F) Primary Care Samiyah Stupka: Maurice Small Other Clinician: Referring Rhea Kaelin: Treating Lyn Joens/Extender: Eduard Roux in Treatment: 2 Wound Status Wound Number: 1 Primary Etiology: 2nd degree Burn Wound Location: Right, Lateral Lower Leg Wound Status: Healed - Epithelialized Wounding Event: Thermal Burn Comorbid History: Anemia, Asthma Date Acquired: 02/24/2022 Weeks Of Treatment: 2 Clustered Wound: No Photos Wound Measurements TABITHIA, STRODER ([id number]) Length: (cm) Width: (cm) Depth: (cm) Area: (cm) Volume: (cm) [id number] Page 7 of 7 0 % Reduction in Area: 100% 0 % Reduction in Volume: 100% 0 Epithelialization: Large (67-100%) 0 Tunneling: No 0 Undermining: No Wound Description Classification: Partial Thickness Exudate Amount: None Present Foul Odor After Cleansing: No Slough/Fibrino No Wound Bed Granulation Amount: None Present (0%) Exposed Structure Necrotic Amount: None Present (0%) Fascia Exposed: No Fat Layer (Subcutaneous Tissue) Exposed: No Tendon Exposed: No Muscle Exposed: No Joint Exposed: No Bone Exposed: No Periwound Skin Texture  Texture Color No Abnormalities Noted: No No Abnormalities Noted: No Rash: Yes Temperature / Pain Scarring: Yes Temperature: No Abnormality Moisture No Abnormalities Noted: No Electronic Signature(s) Signed: 03/27/2022 11:56:39 AM By: Dellie Catholic RN Entered By: Dellie Catholic on 03/27/2022 11:38:59 -------------------------------------------------------------------------------- Vitals Details Patient Name: Date of Service: Crista Luria.  03/27/2022 11:00 A M Medical Record Number: [id number] Patient Account Number: [id number] Date of Birth/Sex: Treating RN: 10-15-1970 (51 y.o. F) Primary Care Izayiah Tibbitts: Kelton Pillar Other Clinician: Referring Tamaka Sawin: Treating Renelda Kilian/Extender: Tonna Corner in Treatment: 2 Vital Signs Time Taken: 10:55 Temperature (F): 98.7 Height (in): 62 Pulse (bpm): 69 Weight (lbs): 135 Respiratory Rate (breaths/min): 20 Body Mass Index (BMI): 24.7 Blood Pressure (mmHg): 111/74 Reference Range: 80 - 120 mg / dl Electronic Signature(s) Signed: 03/27/2022 11:57:43 AM By: Worthy Rancher Entered By: Worthy Rancher on 03/27/2022 10:59:20
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
